# Patient Record
Sex: Female | Born: 1965 | Race: White | Hispanic: No | Marital: Married | Smoking: Never smoker
Health system: Southern US, Community
[De-identification: ages and names within clinical notes are randomized; demographics above are authoritative.]

## PROBLEM LIST (undated history)

## (undated) HISTORY — PX: DILATION AND CURETTAGE OF UTERUS: SHX78

## (undated) HISTORY — PX: TONSILLECTOMY: SUR1361

## (undated) HISTORY — PX: TUBAL LIGATION: SHX77

## (undated) HISTORY — PX: ADENOIDECTOMY: SUR15

---

## 2011-09-26 LAB — HM PAP SMEAR: HM Pap smear: NORMAL

## 2013-04-03 DIAGNOSIS — G118 Other hereditary ataxias: Secondary | ICD-10-CM | POA: Insufficient documentation

## 2013-09-25 HISTORY — PX: CATARACT EXTRACTION: SUR2

## 2013-09-30 ENCOUNTER — Ambulatory Visit: Payer: Self-pay | Admitting: Internal Medicine

## 2013-12-24 LAB — HM COLONOSCOPY

## 2014-05-11 DIAGNOSIS — H269 Unspecified cataract: Secondary | ICD-10-CM | POA: Insufficient documentation

## 2014-10-06 ENCOUNTER — Ambulatory Visit: Payer: Self-pay | Admitting: Internal Medicine

## 2014-10-06 LAB — HM MAMMOGRAPHY

## 2014-10-13 ENCOUNTER — Ambulatory Visit: Payer: Self-pay | Admitting: Internal Medicine

## 2015-04-14 ENCOUNTER — Encounter: Payer: Self-pay | Admitting: Internal Medicine

## 2015-04-14 ENCOUNTER — Ambulatory Visit (INDEPENDENT_AMBULATORY_CARE_PROVIDER_SITE_OTHER): Payer: BC Managed Care – PPO | Admitting: Internal Medicine

## 2015-04-14 VITALS — BP 112/70 | HR 60 | Temp 97.8°F | Ht 67.0 in | Wt 170.8 lb

## 2015-04-14 DIAGNOSIS — G43009 Migraine without aura, not intractable, without status migrainosus: Secondary | ICD-10-CM | POA: Insufficient documentation

## 2015-04-14 DIAGNOSIS — J0111 Acute recurrent frontal sinusitis: Secondary | ICD-10-CM

## 2015-04-14 DIAGNOSIS — G43109 Migraine with aura, not intractable, without status migrainosus: Secondary | ICD-10-CM | POA: Insufficient documentation

## 2015-04-14 DIAGNOSIS — M069 Rheumatoid arthritis, unspecified: Secondary | ICD-10-CM | POA: Insufficient documentation

## 2015-04-14 DIAGNOSIS — E782 Mixed hyperlipidemia: Secondary | ICD-10-CM | POA: Insufficient documentation

## 2015-04-14 DIAGNOSIS — E236 Other disorders of pituitary gland: Secondary | ICD-10-CM | POA: Insufficient documentation

## 2015-04-14 DIAGNOSIS — E785 Hyperlipidemia, unspecified: Secondary | ICD-10-CM | POA: Insufficient documentation

## 2015-04-14 DIAGNOSIS — E039 Hypothyroidism, unspecified: Secondary | ICD-10-CM | POA: Insufficient documentation

## 2015-04-14 DIAGNOSIS — M1990 Unspecified osteoarthritis, unspecified site: Secondary | ICD-10-CM | POA: Insufficient documentation

## 2015-04-14 MED ORDER — AMOXICILLIN-POT CLAVULANATE 875-125 MG PO TABS: 1.0000 | ORAL_TABLET | Freq: Two times a day (BID) | ORAL | Status: DC

## 2015-04-14 NOTE — Progress Notes (Signed)
Date:  04/14/2015   Name:  Cindy Fowler   DOB:  [birth year]   MRN:  [id number]   Chief Complaint: Cough Cough This is a new problem. The current episode started in the past 7 days. The problem has been gradually worsening. The problem occurs constantly. The cough is non-productive (dry and hacking.). Associated symptoms include ear congestion, nasal congestion, postnasal drip and a sore throat. Pertinent negatives include no chest pain, chills, ear pain, fever, heartburn or myalgias. The symptoms are aggravated by lying down and exercise. She has tried OTC cough suppressant for the symptoms. The treatment provided mild relief. There is no history of asthma.     Review of Systems:  Review of Systems  Constitutional: Positive for fatigue. Negative for fever and chills.  HENT: Positive for congestion, postnasal drip, sinus pressure and sore throat. Negative for ear pain, sneezing, trouble swallowing and voice change.   Eyes: Negative for discharge.  Respiratory: Positive for cough.   Cardiovascular: Negative for chest pain, palpitations and leg swelling.  Gastrointestinal: Negative for heartburn, abdominal pain and diarrhea.  Musculoskeletal: Negative for myalgias.  Neurological: Negative for dizziness and light-headedness.    Patient Active Problem List   Diagnosis Date Noted  . Hyperlipidemia 04/14/2015  . Hypothyroidism 04/14/2015  . Empty sella 04/14/2015  . 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 Arthritis 04/14/2015  . Cataract 05/11/2014  . 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 Authorizing Provider  acetaZOLAMIDE (DIAMOX) 125 MG tablet Take 1 tablet by mouth 2 (two) times daily.   Yes Historical Provider, MD  Calcium Carbonate 1500 (600 CA) MG TABS Take 1 tablet by mouth daily at 2 PM daily at 2 PM.   Yes Historical Provider, MD  Cholecalciferol (VITAMIN D-1000 MAX ST)  1000 UNITS tablet Take 1 tablet by mouth daily at 2 PM daily at 2 PM.   Yes Historical Provider, MD  levothyroxine (SYNTHROID) 112 MCG tablet Take 1 tablet by mouth daily at 2 PM daily at 2 PM.   Yes Historical Provider, MD  MULTIPLE VITAMIN PO Take by mouth.   Yes Historical Provider, MD  prednisoLONE acetate (PRED FORTE) 1 % ophthalmic suspension 1 drop right eye four times a day for 1 week, then 1 drop right eye two times a day for 1 week, then stop. 04/06/15  Yes Historical Provider, MD  venlafaxine XR (EFFEXOR-XR) 37.5 MG 24 hr capsule Take 1 capsule by mouth daily at 2 PM daily at 2 PM.   Yes Historical Provider, MD    Allergies  Allergen Reactions  . Fluconazole Swelling    eyes  . Propoxyphene Nausea And Vomiting  . Clarithromycin Palpitations and Anxiety    Shakey, GI upset    Past Surgical History  Procedure Laterality Date  . Cataract extraction Bilateral 2015  . Cesarean section  1994  . Adenoidectomy    . Tonsillectomy    . Dilation and curettage of uterus    . Tubal ligation      History  Substance Use Topics  . Smoking status: Never Smoker   . Smokeless tobacco: Not on file  . Alcohol Use: 0.0 oz/week    0 Standard drinks or equivalent per week     Medication list has been reviewed and updated.  Physical Examination:  Physical Exam  Constitutional: She appears well-developed and well-nourished.  HENT:  Right Ear: Tympanic membrane and ear canal  normal.  Left Ear: Tympanic membrane and ear canal normal.  Nose: Right sinus exhibits maxillary sinus tenderness and frontal sinus tenderness. Left sinus exhibits maxillary sinus tenderness and frontal sinus tenderness.  Mouth/Throat: Uvula is midline, oropharynx is clear and moist and mucous membranes are normal.  Eyes: Conjunctivae are normal.  Neck: Normal range of motion. Neck supple. Carotid bruit is not present. No thyromegaly present.  Cardiovascular: Normal rate and regular rhythm.   Pulmonary/Chest:  Effort normal and breath sounds normal. She has no wheezes. She has no rhonchi.  Lymphadenopathy:    She has no cervical adenopathy.  Psychiatric: She has a normal mood and affect.    BP 112/70 mmHg  Pulse 60  Temp(Src) 97.8 F (36.6 C)  Ht 5\' 7"  (1.702 m)  Wt 170 lb 12.8 oz (77.474 kg)  BMI 26.74 kg/m2  SpO2 100%  Assessment and Plan: 1. Acute recurrent frontal sinusitis Continue Flonase spray; begin claritin daily Continue otc cough syrup as needed - amoxicillin-clavulanate (AUGMENTIN) 875-125 MG per tablet; Take 1 tablet by mouth 2 (two) times daily.  Dispense: 20 tablet; Refill: 0   Halina Maidens, MD [facility]  04/14/2015

## 2016-05-05 ENCOUNTER — Encounter: Payer: Self-pay | Admitting: Internal Medicine

## 2016-05-05 ENCOUNTER — Ambulatory Visit (INDEPENDENT_AMBULATORY_CARE_PROVIDER_SITE_OTHER): Payer: BC Managed Care – PPO | Admitting: Internal Medicine

## 2016-05-05 VITALS — BP 120/80 | HR 68 | Ht 67.0 in | Wt 175.0 lb

## 2016-05-05 DIAGNOSIS — E034 Atrophy of thyroid (acquired): Secondary | ICD-10-CM | POA: Diagnosis not present

## 2016-05-05 DIAGNOSIS — N939 Abnormal uterine and vaginal bleeding, unspecified: Secondary | ICD-10-CM | POA: Diagnosis not present

## 2016-05-05 DIAGNOSIS — E038 Other specified hypothyroidism: Secondary | ICD-10-CM

## 2016-05-05 NOTE — Progress Notes (Signed)
Date:  05/05/2016   Name:  Cindy Fowler   DOB:  04-06-1966   MRN:  [id number]   Chief Complaint: Vaginal Bleeding (spotting x 3 weeks everyday, enough to wear a panty liner. "mixed with that I'm tired and achy, so I thought maybe it was my thyroid") Vaginal Bleeding  This is a new problem. The current episode started 1 to 4 weeks ago. The problem occurs daily. The problem has been unchanged. Associated symptoms include constipation. Pertinent negatives include no chills or fever. She is sexually active. Her menstrual history has been regular (History of endometrial biopsy, heavy menstrual bleeding treated both with IUD and oral contraceptives; neither of these treatments were previously tolerated).   Fatigue - complains of general fatigue and some off and on joint discomfort. She is on thyroid medication but has not had labs checked in more than 1 year. She was uncertain whether her symptoms could be coming from thyroid or from her menstrual issues. She also wasn't certain whether her menstrual issues might not be normal.   Review of Systems  Constitutional: Positive for fatigue. Negative for chills and fever.  Eyes: Negative for visual disturbance.  Respiratory: Negative for cough, chest tightness and shortness of breath.   Cardiovascular: Negative for chest pain, palpitations and leg swelling.  Gastrointestinal: Positive for constipation.  Genitourinary: Positive for menstrual problem and vaginal bleeding.  Musculoskeletal: Positive for arthralgias.    Patient Active Problem List   Diagnosis Date Noted  . Hyperlipidemia 04/14/2015  . Hypothyroidism 04/14/2015  . Empty sella ([place]) 04/14/2015  . 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 Arthritis 04/14/2015  . Cataract 05/11/2014  . Episodic ataxia ([place]) 04/03/2013    Prior to Admission medications   Medication Sig Start Date End Date Taking? Authorizing Provider  acetaZOLAMIDE (DIAMOX) 125  MG tablet Take 1 tablet by mouth 2 (two) times daily.   Yes Historical Provider, MD  Calcium Carbonate 1500 (600 CA) MG TABS Take 1 tablet by mouth daily at 2 PM daily at 2 PM.   Yes Historical Provider, MD  Cholecalciferol (VITAMIN D-1000 MAX ST) 1000 UNITS tablet Take 1 tablet by mouth daily at 2 PM daily at 2 PM.   Yes Historical Provider, MD  levothyroxine (SYNTHROID) 112 MCG tablet Take 1 tablet by mouth daily at 2 PM daily at 2 PM.   Yes Historical Provider, MD  MULTIPLE VITAMIN PO Take by mouth.   Yes Historical Provider, MD  venlafaxine XR (EFFEXOR-XR) 75 MG 24 hr capsule Take 1 capsule by mouth daily at 2 PM daily at 2 PM.   Yes Historical Provider, MD    Allergies  Allergen Reactions  . Fluconazole Swelling    eyes  . Propoxyphene Nausea And Vomiting  . Clarithromycin Palpitations and Anxiety    Shakey, GI upset    Past Surgical History:  Procedure Laterality Date  . ADENOIDECTOMY    . CATARACT EXTRACTION Bilateral 2015  . CESAREAN SECTION  1994  . DILATION AND CURETTAGE OF UTERUS    . TONSILLECTOMY    . TUBAL LIGATION      Social History  Substance Use Topics  . Smoking status: Never Smoker  . Smokeless tobacco: Not on file  . Alcohol use 0.0 oz/week     Medication list has been reviewed and updated.   Physical Exam  Constitutional: She is oriented to person, place, and time. She appears well-developed. No distress.  HENT:  Head: Normocephalic and atraumatic.  Cardiovascular: Normal rate, regular rhythm and normal heart sounds.   Pulmonary/Chest: Effort normal and breath sounds normal. No respiratory distress.  Musculoskeletal: Normal range of motion. She exhibits no edema or tenderness.       Right wrist: Normal.       Left wrist: Normal.       Right ankle: Normal.       Left ankle: Normal.  Neurological: She is alert and oriented to person, place, and time.  Skin: Skin is warm and dry. No rash noted.  Psychiatric: She has a normal mood and affect. Her  behavior is normal. Thought content normal.  Nursing note and vitals reviewed.   BP 120/80   Pulse 68   Ht 5\' 7"  (1.702 m)   Wt 175 lb (79.4 kg)   LMP 04/09/2016 (Exact Date)   BMI 27.41 kg/m   Assessment and Plan: 1. Hypothyroidism due to acquired atrophy of thyroid Continue medication - will adjust dose if needed Follow up with Endo otherwise - TSH  2. Abnormal vaginal bleeding Check labs Follow up with GYN - CBC with Differential/Platelet   Halina Maidens, MD [facility]  05/05/2016

## 2016-05-06 LAB — CBC WITH DIFFERENTIAL/PLATELET
BASOS ABS: 0 10*3/uL (ref 0.0–0.2)
Basos: 0 %
EOS (ABSOLUTE): 0.1 10*3/uL (ref 0.0–0.4)
Eos: 2 %
Hematocrit: 40 % (ref 34.0–46.6)
Hemoglobin: 13.5 g/dL (ref 11.1–15.9)
IMMATURE GRANS (ABS): 0 10*3/uL (ref 0.0–0.1)
Immature Granulocytes: 0 %
LYMPHS: 27 %
Lymphocytes Absolute: 2 10*3/uL (ref 0.7–3.1)
MCH: 32.1 pg (ref 26.6–33.0)
MCHC: 33.8 g/dL (ref 31.5–35.7)
MCV: 95 fL (ref 79–97)
MONOS ABS: 0.8 10*3/uL (ref 0.1–0.9)
Monocytes: 10 %
NEUTROS ABS: 4.5 10*3/uL (ref 1.4–7.0)
Neutrophils: 61 %
PLATELETS: 195 10*3/uL (ref 150–379)
RBC: 4.2 x10E6/uL (ref 3.77–5.28)
RDW: 13 % (ref 12.3–15.4)
WBC: 7.5 10*3/uL (ref 3.4–10.8)

## 2016-05-06 LAB — TSH: TSH: 2.37 u[IU]/mL (ref 0.450–4.500)

## 2016-07-21 ENCOUNTER — Ambulatory Visit (INDEPENDENT_AMBULATORY_CARE_PROVIDER_SITE_OTHER): Payer: BC Managed Care – PPO | Admitting: Internal Medicine

## 2016-07-21 ENCOUNTER — Encounter: Payer: Self-pay | Admitting: Internal Medicine

## 2016-07-21 VITALS — BP 136/80 | HR 72 | Ht 67.0 in | Wt 177.0 lb

## 2016-07-21 DIAGNOSIS — J0100 Acute maxillary sinusitis, unspecified: Secondary | ICD-10-CM | POA: Diagnosis not present

## 2016-07-21 MED ORDER — AMOXICILLIN-POT CLAVULANATE 875-125 MG PO TABS: 1.0000 | ORAL_TABLET | Freq: Two times a day (BID) | ORAL | 0 refills | Status: DC

## 2016-07-21 NOTE — Progress Notes (Signed)
Date:  07/21/2016   Name:  Cindy Fowler   DOB:  1966-02-03   MRN:  [id number]   Chief Complaint: Cough (had a "cold" last week with cough and cong.- seemed to have gotten better, went out to exercise and now has the same symptoms, but worse. Drainage without production) Cough  This is a new problem. The current episode started in the past 7 days. The problem has been waxing and waning. The problem occurs hourly. The cough is non-productive. Associated symptoms include headaches, postnasal drip, rhinorrhea and a sore throat. Pertinent negatives include no chest pain, chills, fever, shortness of breath or wheezing. The symptoms are aggravated by exercise. She has tried nothing for the symptoms.      Review of Systems  Constitutional: Negative for chills, fatigue and fever.  HENT: Positive for congestion, postnasal drip, rhinorrhea and sore throat.   Respiratory: Positive for cough. Negative for chest tightness, shortness of breath and wheezing.   Cardiovascular: Negative for chest pain, palpitations and leg swelling.  Neurological: Positive for headaches.    Patient Active Problem List   Diagnosis Date Noted  . Abnormal vaginal bleeding 05/05/2016  . Hyperlipidemia 04/14/2015  . Hypothyroidism 04/14/2015  . Empty sella ([place]) 04/14/2015  . 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 Arthritis 04/14/2015  . Cataract 05/11/2014  . Episodic ataxia ([place]) 04/03/2013    Prior to Admission medications   Medication Sig Start Date End Date Taking? Authorizing Provider  acetaZOLAMIDE (DIAMOX) 125 MG tablet Take 1 tablet by mouth 2 (two) times daily.   Yes Historical Provider, MD  Calcium Carbonate 1500 (600 CA) MG TABS Take 1 tablet by mouth daily at 2 PM daily at 2 PM.   Yes Historical Provider, MD  Cholecalciferol (VITAMIN D-1000 MAX ST) 1000 UNITS tablet Take 1 tablet by mouth daily at 2 PM daily at 2 PM.   Yes Historical Provider, MD  levothyroxine  (SYNTHROID) 112 MCG tablet Take 1 tablet by mouth daily at 2 PM daily at 2 PM.   Yes Historical Provider, MD  MULTIPLE VITAMIN PO Take by mouth.   Yes Historical Provider, MD  venlafaxine XR (EFFEXOR-XR) 75 MG 24 hr capsule Take 1 capsule by mouth daily at 2 PM daily at 2 PM.   Yes Historical Provider, MD    Allergies  Allergen Reactions  . Fluconazole Swelling    eyes  . Propoxyphene Nausea And Vomiting  . Clarithromycin Palpitations and Anxiety    Shakey, GI upset    Past Surgical History:  Procedure Laterality Date  . ADENOIDECTOMY    . CATARACT EXTRACTION Bilateral 2015  . CESAREAN SECTION  1994  . DILATION AND CURETTAGE OF UTERUS    . TONSILLECTOMY    . TUBAL LIGATION      Social History  Substance Use Topics  . Smoking status: Never Smoker  . Smokeless tobacco: Not on file  . Alcohol use 0.0 oz/week     Medication list has been reviewed and updated.   Physical Exam  Constitutional: She is oriented to person, place, and time. She appears well-developed and well-nourished.  HENT:  Right Ear: External ear and ear canal normal. Tympanic membrane is not erythematous and not retracted.  Left Ear: External ear and ear canal normal. Tympanic membrane is not erythematous and not retracted.  Nose: Right sinus exhibits maxillary sinus tenderness and frontal sinus tenderness. Left sinus exhibits maxillary sinus tenderness and frontal sinus tenderness.  Mouth/Throat: Uvula is  midline and mucous membranes are normal. No oral lesions. Posterior oropharyngeal erythema present. No oropharyngeal exudate.  Neck: Normal range of motion.  Cardiovascular: Normal rate, regular rhythm and normal heart sounds.   Pulmonary/Chest: Breath sounds normal. She has no wheezes. She has no rales.  Lymphadenopathy:    She has no cervical adenopathy.  Neurological: She is alert and oriented to person, place, and time.    BP 136/80   Pulse 72   Ht 5\' 7"  (1.702 m)   Wt 177 lb (80.3 kg)   LMP  07/10/2016 (Exact Date)   BMI 27.72 kg/m   Assessment and Plan: 1. Acute maxillary sinusitis, recurrence not specified Continue flonase Add Afrin, fluids, Advil - amoxicillin-clavulanate (AUGMENTIN) 875-125 MG tablet; Take 1 tablet by mouth 2 (two) times daily.  Dispense: 20 tablet; Refill: 0   Halina Maidens, MD [facility]  07/21/2016

## 2016-11-10 ENCOUNTER — Encounter: Payer: Self-pay | Admitting: Internal Medicine

## 2016-11-10 ENCOUNTER — Ambulatory Visit (INDEPENDENT_AMBULATORY_CARE_PROVIDER_SITE_OTHER): Payer: BC Managed Care – PPO | Admitting: Internal Medicine

## 2016-11-10 VITALS — BP 128/64 | HR 64 | Temp 98.0°F | Ht 67.0 in | Wt 178.0 lb

## 2016-11-10 DIAGNOSIS — J40 Bronchitis, not specified as acute or chronic: Secondary | ICD-10-CM | POA: Diagnosis not present

## 2016-11-10 MED ORDER — DOXYCYCLINE HYCLATE 100 MG PO TABS: 100.0000 mg | ORAL_TABLET | Freq: Two times a day (BID) | ORAL | 0 refills | Status: DC

## 2016-11-10 NOTE — Progress Notes (Signed)
Date:  11/10/2016   Name:  Cindy Fowler   DOB:  17-Aug-1966   MRN:  [id number]   Chief Complaint: Cough (X 1 week. No production. Chest sore from cough.) Cough  This is a new problem. The current episode started in the past 7 days. The problem has been gradually worsening. The problem occurs every few minutes. The cough is non-productive. Associated symptoms include chest pain, a fever, a sore throat and shortness of breath. Pertinent negatives include no chills, ear pain or headaches. She has tried OTC cough suppressant for the symptoms. The treatment provided mild relief.      Review of Systems  Constitutional: Positive for fever. Negative for chills and fatigue.  HENT: Positive for congestion, sinus pressure, sore throat and voice change. Negative for ear pain.   Eyes: Negative for visual disturbance.  Respiratory: Positive for cough and shortness of breath.   Cardiovascular: Positive for chest pain.  Gastrointestinal: Negative for abdominal pain, nausea and vomiting.  Neurological: Negative for dizziness, numbness and headaches.    Patient Active Problem List   Diagnosis Date Noted  . Abnormal vaginal bleeding 05/05/2016  . Hyperlipidemia 04/14/2015  . Hypothyroidism 04/14/2015  . Empty sella ([place]) 04/14/2015  . 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 Arthritis 04/14/2015  . Cataract 05/11/2014  . Episodic ataxia ([place]) 04/03/2013    Prior to Admission medications   Medication Sig Start Date End Date Taking? Authorizing Provider  acetaZOLAMIDE (DIAMOX) 125 MG tablet Take 1 tablet by mouth 2 (two) times daily.   Yes Historical Provider, MD  Calcium Carbonate 1500 (600 CA) MG TABS Take 1 tablet by mouth daily at 2 PM daily at 2 PM.   Yes Historical Provider, MD  Cholecalciferol (VITAMIN D-1000 MAX ST) 1000 UNITS tablet Take 1 tablet by mouth daily at 2 PM daily at 2 PM.   Yes Historical Provider, MD  levothyroxine (SYNTHROID) 112 MCG  tablet Take 1 tablet by mouth daily at 2 PM daily at 2 PM.   Yes Historical Provider, MD  MULTIPLE VITAMIN PO Take by mouth.   Yes Historical Provider, MD  venlafaxine XR (EFFEXOR-XR) 75 MG 24 hr capsule Take 1 capsule by mouth daily at 2 PM daily at 2 PM.   Yes Historical Provider, MD    Allergies  Allergen Reactions  . Fluconazole Swelling    eyes  . Propoxyphene Nausea And Vomiting  . Clarithromycin Palpitations and Anxiety    Shakey, GI upset    Past Surgical History:  Procedure Laterality Date  . ADENOIDECTOMY    . CATARACT EXTRACTION Bilateral 2015  . CESAREAN SECTION  1994  . DILATION AND CURETTAGE OF UTERUS    . TONSILLECTOMY    . TUBAL LIGATION      Social History  Substance Use Topics  . Smoking status: Never Smoker  . Smokeless tobacco: Never Used  . Alcohol use 0.0 oz/week     Medication list has been reviewed and updated.   Physical Exam  Constitutional: She is oriented to person, place, and time. She appears well-developed. No distress.  HENT:  Head: Normocephalic and atraumatic.  Right Ear: Tympanic membrane and ear canal normal.  Left Ear: Tympanic membrane and ear canal normal.  Nose: Right sinus exhibits maxillary sinus tenderness. Right sinus exhibits no frontal sinus tenderness. Left sinus exhibits maxillary sinus tenderness. Left sinus exhibits no frontal sinus tenderness.  Mouth/Throat: No posterior oropharyngeal edema or posterior oropharyngeal erythema.  Neck: Normal  range of motion. Neck supple.  Cardiovascular: Normal rate, regular rhythm and normal heart sounds.   Pulmonary/Chest: Effort normal and breath sounds normal. No respiratory distress. She has no decreased breath sounds. She has no wheezes.  Musculoskeletal: Normal range of motion.  Neurological: She is alert and oriented to person, place, and time.  Skin: Skin is warm and dry. No rash noted.  Psychiatric: She has a normal mood and affect. Her behavior is normal. Thought content  normal.  Nursing note and vitals reviewed.   BP 128/64   Pulse 64   Temp 98 F (36.7 C)   Ht 5\' 7"  (1.702 m)   Wt 178 lb (80.7 kg)   LMP 10/26/2016 (Exact Date)   SpO2 98%   BMI 27.88 kg/m   Assessment and Plan: 1. Bronchitis And sinus drainage Begin Claritin and Delsym Continue advil as needed - doxycycline (VIBRA-TABS) 100 MG tablet; Take 1 tablet (100 mg total) by mouth 2 (two) times daily.  Dispense: 20 tablet; Refill: 0   Halina Maidens, MD [facility]  11/10/2016

## 2016-11-10 NOTE — Patient Instructions (Signed)
Delsym for cough  Claritin/zyrtec/allegra - once a day for sinus sx

## 2016-11-14 ENCOUNTER — Encounter: Payer: Self-pay | Admitting: Internal Medicine

## 2017-04-17 ENCOUNTER — Ambulatory Visit (INDEPENDENT_AMBULATORY_CARE_PROVIDER_SITE_OTHER): Payer: BC Managed Care – PPO | Admitting: Internal Medicine

## 2017-04-17 ENCOUNTER — Encounter: Payer: Self-pay | Admitting: Internal Medicine

## 2017-04-17 VITALS — BP 118/62 | HR 66 | Ht 67.0 in | Wt 175.0 lb

## 2017-04-17 DIAGNOSIS — Z1239 Encounter for other screening for malignant neoplasm of breast: Secondary | ICD-10-CM

## 2017-04-17 DIAGNOSIS — G118 Other hereditary ataxias: Secondary | ICD-10-CM

## 2017-04-17 DIAGNOSIS — Z0000 Encounter for general adult medical examination without abnormal findings: Secondary | ICD-10-CM

## 2017-04-17 DIAGNOSIS — G43109 Migraine with aura, not intractable, without status migrainosus: Secondary | ICD-10-CM | POA: Diagnosis not present

## 2017-04-17 DIAGNOSIS — E034 Atrophy of thyroid (acquired): Secondary | ICD-10-CM | POA: Diagnosis not present

## 2017-04-17 LAB — POCT URINALYSIS DIPSTICK
Bilirubin, UA: NEGATIVE
GLUCOSE UA: NEGATIVE
Ketones, UA: NEGATIVE
Leukocytes, UA: NEGATIVE
NITRITE UA: NEGATIVE
PH UA: 6 (ref 5.0–8.0)
PROTEIN UA: NEGATIVE
RBC UA: NEGATIVE
UROBILINOGEN UA: 0.2 U/dL

## 2017-04-17 NOTE — Patient Instructions (Signed)
Melatonin for sleep - take up 10 mg   Breast Self-Awareness Breast self-awareness means being familiar with how your breasts look and feel. It involves checking your breasts regularly and reporting any changes to your health care provider. Practicing breast self-awareness is important. A change in your breasts can be a sign of a serious medical problem. Being familiar with how your breasts look and feel allows you to find any problems early, when treatment is more likely to be successful. All women should practice breast self-awareness, including women who have had breast implants. How to do a breast self-exam One way to learn what is normal for your breasts and whether your breasts are changing is to do a breast self-exam. To do a breast self-exam: Look for Changes  1. Remove all the clothing above your waist. 2. Stand in front of a mirror in a room with good lighting. 3. Put your hands on your hips. 4. Push your hands firmly downward. 5. Compare your breasts in the mirror. Look for differences between them (asymmetry), such as: ? Differences in shape. ? Differences in size. ? Puckers, dips, and bumps in one breast and not the other. 6. Look at each breast for changes in your skin, such as: ? Redness. ? Scaly areas. 7. Look for changes in your nipples, such as: ? Discharge. ? Bleeding. ? Dimpling. ? Redness. ? A change in position. Feel for Changes  Carefully feel your breasts for lumps and changes. It is best to do this while lying on your back on the floor and again while sitting or standing in the shower or tub with soapy water on your skin. Feel each breast in the following way:  Place the arm on the side of the breast you are examining above your head.  Feel your breast with the other hand.  Start in the nipple area and make  inch (2 cm) overlapping circles to feel your breast. Use the pads of your three middle fingers to do this. Apply light pressure, then medium pressure,  then firm pressure. The light pressure will allow you to feel the tissue closest to the skin. The medium pressure will allow you to feel the tissue that is a little deeper. The firm pressure will allow you to feel the tissue close to the ribs.  Continue the overlapping circles, moving downward over the breast until you feel your ribs below your breast.  Move one finger-width toward the center of the body. Continue to use the  inch (2 cm) overlapping circles to feel your breast as you move slowly up toward your collarbone.  Continue the up and down exam using all three pressures until you reach your armpit.  Write Down What You Find  Write down what is normal for each breast and any changes that you find. Keep a written record with breast changes or normal findings for each breast. By writing this information down, you do not need to depend only on memory for size, tenderness, or location. Write down where you are in your menstrual cycle, if you are still menstruating. If you are having trouble noticing differences in your breasts, do not get discouraged. With time you will become more familiar with the variations in your breasts and more comfortable with the exam. How often should I examine my breasts? Examine your breasts every month. If you are breastfeeding, the best time to examine your breasts is after a feeding or after using a breast pump. If you menstruate, the best  time to examine your breasts is 5-7 days after your period is over. During your period, your breasts are lumpier, and it may be more difficult to notice changes. When should I see my health care provider? See your health care provider if you notice:  A change in shape or size of your breasts or nipples.  A change in the skin of your breast or nipples, such as a reddened or scaly area.  Unusual discharge from your nipples.  A lump or thick area that was not there before.  Pain in your breasts.  Anything that concerns  you.  This information is not intended to replace advice given to you by your health care provider. Make sure you discuss any questions you have with your health care provider. Document Released: 09/11/2005 Document Revised: 02/17/2016 Document Reviewed: 08/01/2015 Elsevier Interactive Patient Education  [employer].

## 2017-04-17 NOTE — Progress Notes (Signed)
Date:  04/17/2017   Name:  Cindy Fowler   DOB:  1966-08-29   MRN:  [id number]   Chief Complaint: Annual Exam and Hypothyroidism Cindy Fowler is a 51 y.o. female who presents today for her Complete Annual Exam. She feels fairly well. She reports exercising regularly. She reports she is sleeping poorly due to night sweats. She sees OB GYN at [place] - Pap done last year.  Mammogram is due. She denies breast mass or tenderness. She is having some night sweats and sleep is interrupted.  She is feeling slightly more fatigued than usual but is also training for a 10 K.  Thyroid Problem  Presents for follow-up visit. Symptoms include fatigue. Patient reports no anxiety, constipation, diarrhea, palpitations or tremors. The symptoms have been stable.   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  Last seen in 08/2016.  She continues on diamox with good effect.  Actually training for a 10K race.  Atypical migraine - stable symptoms.  On venlafaxine. Has a worse headache about once a week or less. Sometimes take Advil if needed.  Review of Systems  Constitutional: Positive for fatigue. Negative for chills and fever.  HENT: Negative for congestion, hearing loss, tinnitus, trouble swallowing and voice change.   Eyes: Negative for visual disturbance.  Respiratory: Negative for cough, chest tightness, shortness of breath and wheezing.   Cardiovascular: Negative for chest pain, palpitations and leg swelling.  Gastrointestinal: Negative for abdominal pain, constipation, diarrhea and vomiting.  Endocrine: Negative for polydipsia and polyuria.  Genitourinary: Negative for dysuria, frequency, genital sores, vaginal bleeding and vaginal discharge.  Musculoskeletal: Negative for arthralgias, gait problem and joint swelling.  Skin: Negative for color change and rash.  Neurological: Positive for headaches. Negative for dizziness, tremors and light-headedness.  Hematological: Negative  for adenopathy. Does not bruise/bleed easily.  Psychiatric/Behavioral: Positive for sleep disturbance. Negative for dysphoric mood. The patient is not nervous/anxious.     Patient Active Problem List   Diagnosis Date Noted  . Abnormal vaginal bleeding 05/05/2016  . Hyperlipidemia 04/14/2015  . Hypothyroidism 04/14/2015  . Empty sella ([place]) 04/14/2015  . 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 Arthritis 04/14/2015  . Cataract 05/11/2014  . Episodic ataxia ([place]) 04/03/2013    Prior to Admission medications   Medication Sig Start Date End Date Taking? Authorizing Provider  acetaZOLAMIDE (DIAMOX) 125 MG tablet Take by mouth. 08/28/16  Yes [provider]  levothyroxine (SYNTHROID) 112 MCG tablet Take by mouth. 06/29/16  Yes [provider]  venlafaxine XR (EFFEXOR-XR) 75 MG 24 hr capsule Take by mouth. 08/28/16  Yes [provider]  Calcium Carbonate 1500 (600 CA) MG TABS Take 1 tablet by mouth daily at 2 PM daily at 2 PM.    [provider]  Cholecalciferol (VITAMIN D-1000 MAX ST) 1000 UNITS tablet Take 1 tablet by mouth daily at 2 PM daily at 2 PM.    [provider]  doxycycline (VIBRA-TABS) 100 MG tablet Take 1 tablet (100 mg total) by mouth 2 (two) times daily. 11/10/16   Glean Hess, MD  MULTIPLE VITAMIN PO Take by mouth.    [provider]    Allergies  Allergen Reactions  . Fluconazole Swelling    eyes  . Propoxyphene Nausea And Vomiting  . Clarithromycin Palpitations and Anxiety    Shakey, GI upset    Past Surgical History:  Procedure Laterality Date  . ADENOIDECTOMY    . CATARACT  EXTRACTION Bilateral 2015  . CESAREAN SECTION  1994  . DILATION AND CURETTAGE OF UTERUS    . TONSILLECTOMY    . TUBAL LIGATION      Social History  Substance Use Topics  . Smoking status: Never Smoker  . Smokeless tobacco: Never Used  . Alcohol use 0.0 oz/week   Depression screen [facility] 2/9 04/17/2017    Decreased Interest 0  Down, Depressed, Hopeless 0  PHQ - 2 Score 0    Medication list has been reviewed and updated.  Physical Exam  Constitutional: She is oriented to person, place, and time. She appears well-developed and well-nourished. No distress.  HENT:  Head: Normocephalic and atraumatic.  Right Ear: Tympanic membrane and ear canal normal.  Left Ear: Tympanic membrane and ear canal normal.  Nose: Right sinus exhibits no maxillary sinus tenderness. Left sinus exhibits no maxillary sinus tenderness.  Mouth/Throat: Uvula is midline and oropharynx is clear and moist.  Eyes: Conjunctivae and EOM are normal. Right eye exhibits no discharge. Left eye exhibits no discharge. No scleral icterus.  Neck: Normal range of motion. Carotid bruit is not present. No erythema present. No thyromegaly present.  Cardiovascular: Normal rate, regular rhythm, normal heart sounds and normal pulses.   Pulmonary/Chest: Effort normal. No respiratory distress. She has no wheezes.  Abdominal: Soft. Bowel sounds are normal. There is no hepatosplenomegaly. There is no tenderness. There is no CVA tenderness.  Musculoskeletal: She exhibits no edema or tenderness.  Lymphadenopathy:    She has no cervical adenopathy.    She has no axillary adenopathy.  Neurological: She is alert and oriented to person, place, and time. She has normal reflexes. No cranial nerve deficit or sensory deficit.  Skin: Skin is warm, dry and intact. No rash noted.  Psychiatric: She has a normal mood and affect. Her speech is normal and behavior is normal. Thought content normal.  Nursing note and vitals reviewed.   BP 118/62   Pulse 66   Ht 5\' 7"  (1.702 m)   Wt 175 lb (79.4 kg)   LMP 04/06/2017   SpO2 98%   BMI 27.41 kg/m   Assessment and Plan: 1. Annual physical exam Normal exam Suspect mild fatigue from sleep disruption - begin melatonin qhs - Lipid panel - POCT urinalysis dipstick - Hemoglobin A1c  2. Breast cancer  screening Schedule at [place]; Future  3. 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 Episodic ataxia (HCC) controlled - CBC with Differential/Platelet - Comprehensive metabolic panel  5. Basilar artery migraine stable   No orders of the defined types were placed in this encounter.   Halina Maidens, MD [facility]  04/17/2017

## 2017-04-18 LAB — COMPREHENSIVE METABOLIC PANEL
A/G RATIO: 1.7 (ref 1.2–2.2)
ALK PHOS: 51 IU/L (ref 39–117)
ALT: 10 IU/L (ref 0–32)
AST: 23 IU/L (ref 0–40)
Albumin: 4.2 g/dL (ref 3.5–5.5)
BILIRUBIN TOTAL: 0.6 mg/dL (ref 0.0–1.2)
BUN/Creatinine Ratio: 11 (ref 9–23)
BUN: 8 mg/dL (ref 6–24)
CHLORIDE: 101 mmol/L (ref 96–106)
CO2: 23 mmol/L (ref 20–29)
Calcium: 9.6 mg/dL (ref 8.7–10.2)
Creatinine, Ser: 0.7 mg/dL (ref 0.57–1.00)
GFR calc Af Amer: 117 mL/min/{1.73_m2} (ref >59)
GFR calc non Af Amer: 101 mL/min/{1.73_m2} (ref >59)
GLUCOSE: 102 mg/dL — AB (ref 65–99)
Globulin, Total: 2.5 g/dL (ref 1.5–4.5)
POTASSIUM: 4.4 mmol/L (ref 3.5–5.2)
Sodium: 139 mmol/L (ref 134–144)
Total Protein: 6.7 g/dL (ref 6.0–8.5)

## 2017-04-18 LAB — CBC WITH DIFFERENTIAL/PLATELET
BASOS ABS: 0 10*3/uL (ref 0.0–0.2)
BASOS: 0 %
EOS (ABSOLUTE): 0.2 10*3/uL (ref 0.0–0.4)
Eos: 3 %
Hematocrit: 41.2 % (ref 34.0–46.6)
Hemoglobin: 13.6 g/dL (ref 11.1–15.9)
IMMATURE GRANS (ABS): 0 10*3/uL (ref 0.0–0.1)
IMMATURE GRANULOCYTES: 0 %
LYMPHS: 26 %
Lymphocytes Absolute: 1.3 10*3/uL (ref 0.7–3.1)
MCH: 30.6 pg (ref 26.6–33.0)
MCHC: 33 g/dL (ref 31.5–35.7)
MCV: 93 fL (ref 79–97)
MONOS ABS: 0.5 10*3/uL (ref 0.1–0.9)
Monocytes: 11 %
NEUTROS ABS: 2.9 10*3/uL (ref 1.4–7.0)
NEUTROS PCT: 60 %
PLATELETS: 217 10*3/uL (ref 150–379)
RBC: 4.44 x10E6/uL (ref 3.77–5.28)
RDW: 13.9 % (ref 12.3–15.4)
WBC: 4.8 10*3/uL (ref 3.4–10.8)

## 2017-04-18 LAB — LIPID PANEL
CHOL/HDL RATIO: 3.8 ratio (ref 0.0–4.4)
CHOLESTEROL TOTAL: 267 mg/dL — AB (ref 100–199)
HDL: 70 mg/dL (ref >39)
LDL Calculated: 168 mg/dL — ABNORMAL HIGH (ref 0–99)
TRIGLYCERIDES: 147 mg/dL (ref 0–149)
VLDL Cholesterol Cal: 29 mg/dL (ref 5–40)

## 2017-04-18 LAB — HEMOGLOBIN A1C
Est. average glucose Bld gHb Est-mCnc: 105 mg/dL
Hgb A1c MFr Bld: 5.3 % (ref 4.8–5.6)

## 2017-04-18 LAB — TSH: TSH: 0.941 u[IU]/mL (ref 0.450–4.500)

## 2017-04-24 ENCOUNTER — Ambulatory Visit
Admission: RE | Admit: 2017-04-24 | Discharge: 2017-04-24 | Disposition: A | Discharge status: Home / Self Care | Payer: BC Managed Care – PPO | Source: Ambulatory Visit | Attending: Internal Medicine | Admitting: Internal Medicine

## 2017-04-24 DIAGNOSIS — Z1239 Encounter for other screening for malignant neoplasm of breast: Secondary | ICD-10-CM

## 2017-04-24 DIAGNOSIS — Z1231 Encounter for screening mammogram for malignant neoplasm of breast: Secondary | ICD-10-CM | POA: Diagnosis present

## 2017-08-06 ENCOUNTER — Other Ambulatory Visit: Payer: Self-pay

## 2017-08-06 MED ORDER — LEVOTHYROXINE SODIUM 112 MCG PO TABS: 112.0000 ug | ORAL_TABLET | Freq: Every day | ORAL | 1 refills | Status: DC

## 2017-08-06 NOTE — Progress Notes (Signed)
Patient called leaving [facility] requesting refill on thyroid medication. Was told at last visit Dr Army Melia can take over thyroid meds if lab was done. Sent in synthroid medication for 90 with one refill.

## 2017-11-04 ENCOUNTER — Encounter: Payer: Self-pay | Admitting: Internal Medicine

## 2017-11-05 ENCOUNTER — Other Ambulatory Visit: Payer: Self-pay

## 2017-11-05 MED ORDER — LEVOTHYROXINE SODIUM 112 MCG PO TABS: 112.0000 ug | ORAL_TABLET | Freq: Every day | ORAL | 1 refills | Status: DC

## 2018-03-12 ENCOUNTER — Other Ambulatory Visit: Payer: Self-pay | Admitting: Internal Medicine

## 2018-03-12 DIAGNOSIS — Z1231 Encounter for screening mammogram for malignant neoplasm of breast: Secondary | ICD-10-CM

## 2018-04-01 LAB — HM PAP SMEAR: HM Pap smear: NEGATIVE

## 2018-04-01 LAB — RESULTS CONSOLE HPV: CHL HPV: NEGATIVE

## 2018-04-18 ENCOUNTER — Encounter: Payer: BC Managed Care – PPO | Admitting: Internal Medicine

## 2018-04-22 ENCOUNTER — Encounter: Payer: Self-pay | Admitting: Internal Medicine

## 2018-04-22 ENCOUNTER — Ambulatory Visit (INDEPENDENT_AMBULATORY_CARE_PROVIDER_SITE_OTHER): Payer: BC Managed Care – PPO | Admitting: Internal Medicine

## 2018-04-22 VITALS — BP 132/70 | HR 64 | Ht 67.0 in | Wt 173.0 lb

## 2018-04-22 DIAGNOSIS — G8929 Other chronic pain: Secondary | ICD-10-CM | POA: Diagnosis not present

## 2018-04-22 DIAGNOSIS — Z0000 Encounter for general adult medical examination without abnormal findings: Secondary | ICD-10-CM

## 2018-04-22 DIAGNOSIS — Z0001 Encounter for general adult medical examination with abnormal findings: Secondary | ICD-10-CM | POA: Diagnosis not present

## 2018-04-22 DIAGNOSIS — M25561 Pain in right knee: Secondary | ICD-10-CM

## 2018-04-22 DIAGNOSIS — E034 Atrophy of thyroid (acquired): Secondary | ICD-10-CM | POA: Diagnosis not present

## 2018-04-22 DIAGNOSIS — M25562 Pain in left knee: Secondary | ICD-10-CM | POA: Diagnosis not present

## 2018-04-22 DIAGNOSIS — E236 Other disorders of pituitary gland: Secondary | ICD-10-CM

## 2018-04-22 DIAGNOSIS — G43109 Migraine with aura, not intractable, without status migrainosus: Secondary | ICD-10-CM

## 2018-04-22 DIAGNOSIS — G118 Other hereditary ataxias: Secondary | ICD-10-CM

## 2018-04-22 LAB — POCT URINALYSIS DIPSTICK
Bilirubin, UA: NEGATIVE
GLUCOSE UA: NEGATIVE
Ketones, UA: NEGATIVE
LEUKOCYTES UA: NEGATIVE
Nitrite, UA: NEGATIVE
Protein, UA: NEGATIVE
Spec Grav, UA: 1.01 (ref 1.010–1.025)
Urobilinogen, UA: 0.2 E.U./dL
pH, UA: 6.5 (ref 5.0–8.0)

## 2018-04-22 MED ORDER — LEVOTHYROXINE SODIUM 112 MCG PO TABS: 112.0000 ug | ORAL_TABLET | Freq: Every day | ORAL | 1 refills | Status: DC

## 2018-04-22 NOTE — Patient Instructions (Signed)

## 2018-04-22 NOTE — Progress Notes (Signed)
Date:  04/22/2018   Name:  Cindy Fowler   DOB:  07-Dec-1965   MRN:  [id number]   Chief Complaint: Annual Exam Cindy Fowler is a 52 y.o. female who presents today for her Complete Annual Exam. She feels fairly well. She reports exercising walking and swimming. She reports she is sleeping fairly well. She sees GYN routinely. She has a mammogram scheduled for next week.  Thyroid Problem  Presents for follow-up visit. Symptoms include constipation. Patient reports no anxiety, depressed mood, diarrhea, fatigue, palpitations, tremors or weight gain. The symptoms have been stable.  Migraine   This is a recurrent problem. The pain does not radiate. The pain quality is similar to prior headaches. Associated symptoms include dizziness. Pertinent negatives include no abdominal pain, coughing, fever, hearing loss, tinnitus or vomiting.  Knee Pain   There was no injury mechanism. The pain is present in the right knee and left knee. The quality of the pain is described as aching (at times when climbing stairs; able to jog without pain or difficulty). The pain has been fluctuating since onset. Pertinent negatives include no inability to bear weight, loss of motion or loss of sensation. She has tried NSAIDs for the symptoms.  Episodic Ataxia - followed by [facility] for this and migraine, on Diamox and Effexor  Review of Systems  Constitutional: Negative for chills, fatigue, fever and weight gain.  HENT: Negative for congestion, hearing loss, tinnitus, trouble swallowing and voice change.   Eyes: Negative for visual disturbance.  Respiratory: Negative for cough, chest tightness, shortness of breath and wheezing.   Cardiovascular: Negative for chest pain, palpitations and leg swelling.  Gastrointestinal: Positive for constipation. Negative for abdominal distention, abdominal pain, blood in stool, diarrhea and vomiting.  Endocrine: Negative for polydipsia and polyuria.  Genitourinary:  Negative for dysuria, frequency, genital sores, vaginal bleeding and vaginal discharge.  Musculoskeletal: Positive for arthralgias (both knees). Negative for gait problem and joint swelling.  Skin: Negative for color change and rash.  Neurological: Positive for dizziness. Negative for tremors, light-headedness and headaches.  Hematological: Negative for adenopathy. Does not bruise/bleed easily.  Psychiatric/Behavioral: Negative for dysphoric mood and sleep disturbance. The patient is not nervous/anxious.     Patient Active Problem List   Diagnosis Date Noted  . Abnormal vaginal bleeding 05/05/2016  . Hyperlipidemia 04/14/2015  . Hypothyroidism 04/14/2015  . Empty sella ([place]) 04/14/2015  . 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 Arthritis 04/14/2015  . Cataract 05/11/2014  . Episodic ataxia ([place]) 04/03/2013    Prior to Admission medications   Medication Sig Start Date End Date Taking? Authorizing Provider  acetaZOLAMIDE (DIAMOX) 125 MG tablet Take by mouth. 08/28/16  Yes [provider]  Calcium Carbonate 1500 (600 CA) MG TABS Take 1 tablet by mouth daily at 2 PM daily at 2 PM.   Yes [provider]  Cholecalciferol (VITAMIN D-1000 MAX ST) 1000 UNITS tablet Take 1 tablet by mouth daily at 2 PM daily at 2 PM.   Yes [provider]  levothyroxine (SYNTHROID) 112 MCG tablet Take 1 tablet (112 mcg total) by mouth daily before breakfast. 11/05/17  Yes Glean Hess, MD  MULTIPLE VITAMIN PO Take by mouth.   Yes [provider]  venlafaxine XR (EFFEXOR-XR) 75 MG 24 hr capsule Take by mouth. 08/28/16  Yes [provider]    Allergies  Allergen Reactions  . Fluconazole Swelling    eyes  . Propoxyphene Nausea And  Vomiting  . Clarithromycin Palpitations and Anxiety    Shakey, GI upset    Past Surgical History:  Procedure Laterality Date  . ADENOIDECTOMY    . CATARACT EXTRACTION Bilateral 2015  . CESAREAN  SECTION  1994  . DILATION AND CURETTAGE OF UTERUS    . TONSILLECTOMY    . TUBAL LIGATION      Social History   Tobacco Use  . Smoking status: Never Smoker  . Smokeless tobacco: Never Used  Substance Use Topics  . Alcohol use: Yes    Alcohol/week: 0.0 oz  . Drug use: No     Medication list has been reviewed and updated.  Current Meds  Medication Sig  . acetaZOLAMIDE (DIAMOX) 125 MG tablet Take by mouth.  . Calcium Carbonate 1500 (600 CA) MG TABS Take 1 tablet by mouth daily at 2 PM daily at 2 PM.  . Cholecalciferol (VITAMIN D-1000 MAX ST) 1000 UNITS tablet Take 1 tablet by mouth daily at 2 PM daily at 2 PM.  . levothyroxine (SYNTHROID) 112 MCG tablet Take 1 tablet (112 mcg total) by mouth daily before breakfast.  . MULTIPLE VITAMIN PO Take by mouth.  . venlafaxine XR (EFFEXOR-XR) 75 MG 24 hr capsule Take by mouth.    PHQ 2/9 Scores 04/22/2018 04/17/2017  PHQ - 2 Score 0 0    Physical Exam  Constitutional: She is oriented to person, place, and time. She appears well-developed and well-nourished. No distress.  HENT:  Head: Normocephalic and atraumatic.  Right Ear: Tympanic membrane and ear canal normal.  Left Ear: Tympanic membrane and ear canal normal.  Nose: Right sinus exhibits no maxillary sinus tenderness. Left sinus exhibits no maxillary sinus tenderness.  Mouth/Throat: Uvula is midline and oropharynx is clear and moist.  Eyes: Conjunctivae and EOM are normal. Right eye exhibits no discharge. Left eye exhibits no discharge. No scleral icterus.  Neck: Normal range of motion. Carotid bruit is not present. No erythema present. No thyromegaly present.  Cardiovascular: Normal rate, regular rhythm, normal heart sounds and normal pulses.  Pulmonary/Chest: Effort normal. No respiratory distress. She has no wheezes.  Abdominal: Soft. Bowel sounds are normal. There is no hepatosplenomegaly. There is no tenderness. There is no CVA tenderness.  Musculoskeletal: Normal range of  motion.       Right knee: She exhibits normal range of motion, no swelling and no effusion.       Left knee: She exhibits normal range of motion, no swelling and no effusion.  Left patella hypermobile Mild crepitus of right knee with passive ROM  Lymphadenopathy:    She has no cervical adenopathy.    She has no axillary adenopathy.  Neurological: She is alert and oriented to person, place, and time. She has normal reflexes. No cranial nerve deficit or sensory deficit.  Skin: Skin is warm, dry and intact. No rash noted.  Psychiatric: She has a normal mood and affect. Her speech is normal and behavior is normal. Thought content normal.  Nursing note and vitals reviewed.   BP 132/70   Pulse 64   Ht 5\' 7"  (1.702 m)   Wt 173 lb (78.5 kg)   LMP 04/19/2018 (Exact Date)   SpO2 99%   BMI 27.10 kg/m   Assessment and Plan: 1. Annual physical exam Normal exam Continue healthy diet - try [employer] or intermittent fasting - Lipid panel - POCT urinalysis dipstick  2. 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  Dispense: 90 tablet; Refill: 1  3. Episodic ataxia (HCC) Chronic, unchanged Followed by Neurology - CBC with Differential/Platelet  4. 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 Chronic pain of both knees Continue Advil PRN If worsening, becoming unstable, etc will need Ortho eval   Meds ordered this encounter  Medications  . levothyroxine (SYNTHROID) 112 MCG tablet    Sig: Take 1 tablet (112 mcg total) by mouth daily before breakfast.    Dispense:  90 tablet    Refill:  1    Partially dictated using Editor, commissioning. Any errors are unintentional.  Halina Maidens, MD [facility]  04/22/2018

## 2018-04-23 LAB — CBC WITH DIFFERENTIAL/PLATELET
BASOS: 1 %
Basophils Absolute: 0 10*3/uL (ref 0.0–0.2)
EOS (ABSOLUTE): 0.1 10*3/uL (ref 0.0–0.4)
Eos: 2 %
HEMATOCRIT: 40.8 % (ref 34.0–46.6)
Hemoglobin: 13.5 g/dL (ref 11.1–15.9)
Immature Grans (Abs): 0 10*3/uL (ref 0.0–0.1)
Immature Granulocytes: 0 %
LYMPHS ABS: 1.6 10*3/uL (ref 0.7–3.1)
Lymphs: 29 %
MCH: 30.8 pg (ref 26.6–33.0)
MCHC: 33.1 g/dL (ref 31.5–35.7)
MCV: 93 fL (ref 79–97)
MONOS ABS: 0.5 10*3/uL (ref 0.1–0.9)
Monocytes: 9 %
Neutrophils Absolute: 3.3 10*3/uL (ref 1.4–7.0)
Neutrophils: 59 %
PLATELETS: 201 10*3/uL (ref 150–450)
RBC: 4.39 x10E6/uL (ref 3.77–5.28)
RDW: 13 % (ref 12.3–15.4)
WBC: 5.5 10*3/uL (ref 3.4–10.8)

## 2018-04-23 LAB — LIPID PANEL
CHOL/HDL RATIO: 3.3 ratio (ref 0.0–4.4)
CHOLESTEROL TOTAL: 254 mg/dL — AB (ref 100–199)
HDL: 77 mg/dL (ref >39)
LDL Calculated: 160 mg/dL — ABNORMAL HIGH (ref 0–99)
TRIGLYCERIDES: 83 mg/dL (ref 0–149)
VLDL Cholesterol Cal: 17 mg/dL (ref 5–40)

## 2018-04-23 LAB — COMPREHENSIVE METABOLIC PANEL
A/G RATIO: 2 (ref 1.2–2.2)
ALK PHOS: 49 IU/L (ref 39–117)
ALT: 9 IU/L (ref 0–32)
AST: 17 IU/L (ref 0–40)
Albumin: 4.3 g/dL (ref 3.5–5.5)
BILIRUBIN TOTAL: 0.5 mg/dL (ref 0.0–1.2)
BUN/Creatinine Ratio: 16 (ref 9–23)
BUN: 10 mg/dL (ref 6–24)
CHLORIDE: 105 mmol/L (ref 96–106)
CO2: 20 mmol/L (ref 20–29)
Calcium: 8.8 mg/dL (ref 8.7–10.2)
Creatinine, Ser: 0.63 mg/dL (ref 0.57–1.00)
GFR calc Af Amer: 120 mL/min/{1.73_m2} (ref >59)
GFR, EST NON AFRICAN AMERICAN: 104 mL/min/{1.73_m2} (ref >59)
Globulin, Total: 2.1 g/dL (ref 1.5–4.5)
Glucose: 85 mg/dL (ref 65–99)
POTASSIUM: 4.3 mmol/L (ref 3.5–5.2)
Sodium: 140 mmol/L (ref 134–144)
Total Protein: 6.4 g/dL (ref 6.0–8.5)

## 2018-04-23 LAB — TSH: TSH: 0.572 u[IU]/mL (ref 0.450–4.500)

## 2018-04-29 ENCOUNTER — Ambulatory Visit
Admission: RE | Admit: 2018-04-29 | Discharge: 2018-04-29 | Disposition: A | Discharge status: Home / Self Care | Payer: BC Managed Care – PPO | Source: Ambulatory Visit | Attending: Internal Medicine | Admitting: Internal Medicine

## 2018-04-29 DIAGNOSIS — Z1231 Encounter for screening mammogram for malignant neoplasm of breast: Secondary | ICD-10-CM | POA: Insufficient documentation

## 2018-06-02 ENCOUNTER — Encounter: Payer: Self-pay | Admitting: Internal Medicine

## 2018-08-12 ENCOUNTER — Encounter: Payer: Self-pay | Admitting: Internal Medicine

## 2018-08-12 NOTE — Telephone Encounter (Signed)
Please advise patient message regarding thyroid dose.

## 2018-09-19 ENCOUNTER — Encounter: Payer: Self-pay | Admitting: Internal Medicine

## 2018-09-19 ENCOUNTER — Ambulatory Visit: Payer: BC Managed Care – PPO | Admitting: Internal Medicine

## 2018-09-19 VITALS — BP 130/58 | HR 64 | Ht 67.0 in | Wt 173.0 lb

## 2018-09-19 DIAGNOSIS — E034 Atrophy of thyroid (acquired): Secondary | ICD-10-CM | POA: Diagnosis not present

## 2018-09-19 NOTE — Progress Notes (Signed)
Date:  09/19/2018   Name:  Cindy Fowler   DOB:  03-07-1966   MRN:  [id number]   Chief Complaint: Hypothyroidism  Thyroid Problem  Presents for follow-up visit. Patient reports no constipation, depressed mood, fatigue, menstrual problem, palpitations, weight gain or weight loss. (She had a brief spell of elevated systolic BP to 638) The symptoms have been resolved.  She was not taking any medication or supplements that could cause her BP to rise.  She has been exercising regularly.  Wt Readings from Last 3 Encounters:  09/19/18 173 lb (78.5 kg)  04/22/18 173 lb (78.5 kg)  04/17/17 175 lb (79.4 kg)    Lab Results  Component Value Date   TSH 0.572 04/22/2018     Review of Systems  Constitutional: Negative for chills, fatigue, fever, weight gain and weight loss.  HENT: Negative for trouble swallowing.   Respiratory: Negative for chest tightness, shortness of breath and wheezing.   Cardiovascular: Negative for chest pain, palpitations and leg swelling.  Gastrointestinal: Negative for constipation.  Genitourinary: Negative for menstrual problem.  Neurological: Negative for dizziness, light-headedness and headaches.    Patient Active Problem List   Diagnosis Date Noted  . Chronic pain of both knees 04/22/2018  . Abnormal vaginal bleeding 05/05/2016  . Hyperlipidemia 04/14/2015  . Hypothyroidism 04/14/2015  . Empty sella ([place]) 04/14/2015  . 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 Arthritis 04/14/2015  . Cataract 05/11/2014  . Episodic ataxia ([place]) 04/03/2013    Allergies  Allergen Reactions  . Fluconazole Swelling    eyes  . Propoxyphene Nausea And Vomiting  . Clarithromycin Palpitations and Anxiety    Shakey, GI upset    Past Surgical History:  Procedure Laterality Date  . ADENOIDECTOMY    . CATARACT EXTRACTION Bilateral 2015  . CESAREAN SECTION  1994  . DILATION AND CURETTAGE OF UTERUS    . TONSILLECTOMY    . TUBAL  LIGATION      Social History   Tobacco Use  . Smoking status: Never Smoker  . Smokeless tobacco: Never Used  Substance Use Topics  . Alcohol use: Yes    Alcohol/week: 0.0 standard drinks  . Drug use: No     Medication list has been reviewed and updated.  Current Meds  Medication Sig  . acetaZOLAMIDE (DIAMOX) 125 MG tablet Take by mouth.  . Calcium Carbonate 1500 (600 CA) MG TABS Take 1 tablet by mouth daily at 2 PM daily at 2 PM.  . Cholecalciferol (VITAMIN D-1000 MAX ST) 1000 UNITS tablet Take 1 tablet by mouth daily at 2 PM daily at 2 PM.  . levothyroxine (SYNTHROID) 112 MCG tablet Take 1 tablet (112 mcg total) by mouth daily before breakfast.  . Melatonin 3 MG TABS Take 1 tablet by mouth at bedtime.  . MULTIPLE VITAMIN PO Take by mouth.  . venlafaxine XR (EFFEXOR-XR) 75 MG 24 hr capsule Take by mouth.    PHQ 2/9 Scores 04/22/2018 04/17/2017  PHQ - 2 Score 0 0    Physical Exam Vitals signs and nursing note reviewed.  Constitutional:      General: She is not in acute distress.    Appearance: She is well-developed.  HENT:     Head: Normocephalic and atraumatic.  Eyes:     Extraocular Movements: Extraocular movements intact.     Pupils: Pupils are equal, round, and reactive to light.  Cardiovascular:     Rate and Rhythm: Normal rate and regular  rhythm.     Pulses: Normal pulses.     Heart sounds: Normal heart sounds.  Pulmonary:     Effort: Pulmonary effort is normal. No respiratory distress.  Musculoskeletal: Normal range of motion.     Right lower leg: No edema.     Left lower leg: No edema.  Skin:    General: Skin is warm and dry.     Findings: No rash.  Neurological:     Mental Status: She is alert and oriented to person, place, and time.  Psychiatric:        Behavior: Behavior normal.        Thought Content: Thought content normal.     BP (!) 130/58 (BP Location: Right Arm, Patient Position: Sitting, Cuff Size: Normal)   Pulse 64   Ht 5\' 7"  (1.702 m)    Wt 173 lb (78.5 kg)   SpO2 97%   BMI 27.10 kg/m   Assessment and Plan: 1. Hypothyroidism due to acquired atrophy of thyroid Continue current dose (1/2 tab on Sundays) Monitor BP weekly - normal today - Thyroid Panel With TSH   Partially dictated using Dragon software. Any errors are unintentional.  Halina Maidens, MD [facility]  09/19/2018

## 2018-09-20 LAB — THYROID PANEL WITH TSH
Free Thyroxine Index: 1.9 (ref 1.2–4.9)
T3 Uptake Ratio: 27 % (ref 24–39)
T4, Total: 7.2 ug/dL (ref 4.5–12.0)
TSH: 1.19 u[IU]/mL (ref 0.450–4.500)

## 2019-01-14 ENCOUNTER — Encounter: Payer: Self-pay | Admitting: Internal Medicine

## 2019-01-20 ENCOUNTER — Encounter: Payer: Self-pay | Admitting: Internal Medicine

## 2019-01-20 ENCOUNTER — Other Ambulatory Visit: Payer: Self-pay

## 2019-01-20 ENCOUNTER — Ambulatory Visit: Payer: BC Managed Care – PPO | Admitting: Internal Medicine

## 2019-01-20 VITALS — BP 126/74 | HR 68 | Ht 67.0 in | Wt 179.0 lb

## 2019-01-20 DIAGNOSIS — Z860100 Personal history of colon polyps, unspecified: Secondary | ICD-10-CM | POA: Insufficient documentation

## 2019-01-20 DIAGNOSIS — R030 Elevated blood-pressure reading, without diagnosis of hypertension: Secondary | ICD-10-CM

## 2019-01-20 DIAGNOSIS — E034 Atrophy of thyroid (acquired): Secondary | ICD-10-CM

## 2019-01-20 DIAGNOSIS — N939 Abnormal uterine and vaginal bleeding, unspecified: Secondary | ICD-10-CM | POA: Diagnosis not present

## 2019-01-20 DIAGNOSIS — D126 Benign neoplasm of colon, unspecified: Secondary | ICD-10-CM | POA: Diagnosis not present

## 2019-01-20 NOTE — Progress Notes (Signed)
Date:  01/20/2019   Name:  Cindy Fowler   DOB:  Jul 11, 1966   MRN:  [id number]   Chief Complaint: Hypothyroidism (Thyroid check .)  Thyroid Problem  Presents for follow-up visit. Symptoms include constipation, fatigue, menstrual problem (now taking provera) and weight gain. Patient reports no anxiety, heat intolerance or palpitations. (She has been more fatigued lately - still active and exercising but does not feel as energetic)  Elevated BP - noted on last visit to have several elevated pressures.  Pt has been monitoring at home.  Readings have been normal. Colon Polyp - noted in 2015.  She is due for repeat study this year with Dr. Comer Locket.   Lab Results  Component Value Date   TSH 1.190 09/19/2018   T4TOTAL 7.2 09/19/2018    Review of Systems  Constitutional: Positive for fatigue, unexpected weight change and weight gain. Negative for chills.  HENT: Negative for trouble swallowing.   Eyes: Negative for visual disturbance.  Respiratory: Negative for cough, chest tightness, shortness of breath and wheezing.   Cardiovascular: Negative for chest pain, palpitations and leg swelling.  Gastrointestinal: Positive for constipation. Negative for abdominal pain.  Endocrine: Negative for heat intolerance.  Genitourinary: Positive for menstrual problem (now taking provera).  Musculoskeletal: Positive for myalgias (intermittent muscle soreness). Negative for arthralgias, gait problem and joint swelling.  Skin: Negative for color change and rash.  Neurological: Positive for dizziness and headaches. Negative for weakness, light-headedness and numbness.  Psychiatric/Behavioral: Negative for dysphoric mood and sleep disturbance. The patient is not nervous/anxious.     Patient Active Problem List   Diagnosis Date Noted  . Adenomatous colon polyp 01/20/2019  . Chronic pain of both knees 04/22/2018  . Abnormal vaginal bleeding 05/05/2016  . Hyperlipidemia 04/14/2015  .  Hypothyroidism 04/14/2015  . Empty sella ([place]) 04/14/2015  . 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 Arthritis 04/14/2015  . Cataract 05/11/2014  . 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 Propoxyphene Nausea And Vomiting  . Fluconazole Swelling    eyes  . Clarithromycin Palpitations and Anxiety    Shakey, GI upset    Past Surgical History:  Procedure Laterality Date  . ADENOIDECTOMY    . CATARACT EXTRACTION Bilateral 2015  . CESAREAN SECTION  1994  . DILATION AND CURETTAGE OF UTERUS    . TONSILLECTOMY    . TUBAL LIGATION      Social History   Tobacco Use  . Smoking status: Never Smoker  . Smokeless tobacco: Never Used  Substance Use Topics  . Alcohol use: Yes    Alcohol/week: 0.0 standard drinks  . Drug use: No     Medication list has been reviewed and updated.  Current Meds  Medication Sig  . acetaZOLAMIDE (DIAMOX) 125 MG tablet Take by mouth.  . Calcium Carbonate 1500 (600 CA) MG TABS Take 1 tablet by mouth daily at 2 PM daily at 2 PM.  . levothyroxine (SYNTHROID) 112 MCG tablet Take 1 tablet (112 mcg total) by mouth daily before breakfast.  . Melatonin 3 MG TABS Take 1 tablet by mouth at bedtime.  . MULTIPLE VITAMIN PO Take by mouth.  . norethindrone (AYGESTIN) 5 MG tablet Take 2.5 mg by mouth daily. Taking 5 mg for breakthrough bleeding.  . venlafaxine XR (EFFEXOR-XR) 75 MG 24 hr capsule Take by mouth.    PHQ 2/9 Scores 01/20/2019 01/20/2019 04/22/2018 04/17/2017  PHQ - 2 Score 1 0 0  0    BP Readings from Last 3 Encounters:  01/20/19 126/74  09/19/18 (!) 130/58  04/22/18 132/70    Physical Exam Vitals signs and nursing note reviewed.  Constitutional:      General: She is not in acute distress.    Appearance: She is well-developed.  HENT:     Head: Normocephalic and atraumatic.     Mouth/Throat:     Mouth: Mucous membranes are moist.  Eyes:     Pupils: Pupils are equal, round, and  reactive to light.  Neck:     Musculoskeletal: Normal range of motion and neck supple.  Cardiovascular:     Rate and Rhythm: Regular rhythm. Tachycardia present.     Pulses: Normal pulses.     Heart sounds: No murmur.  Pulmonary:     Effort: Pulmonary effort is normal. No respiratory distress.     Breath sounds: No wheezing or rhonchi.  Musculoskeletal: Normal range of motion.     Right lower leg: No edema.     Left lower leg: No edema.  Skin:    General: Skin is warm and dry.     Findings: No rash.  Neurological:     Mental Status: She is alert and oriented to person, place, and time.  Psychiatric:        Behavior: Behavior normal.        Thought Content: Thought content normal.     Wt Readings from Last 3 Encounters:  01/20/19 179 lb (81.2 kg)  09/19/18 173 lb (78.5 kg)  04/22/18 173 lb (78.5 kg)    BP 126/74   Pulse 68   Ht 5\' 7"  (1.702 m)   Wt 179 lb (81.2 kg)   LMP 10/09/2018 (Exact Date)   SpO2 98%   BMI 28.04 kg/m   Assessment and Plan: 1. Hypothyroidism due to acquired atrophy of thyroid Check labs and adjust dose if needed Sx may be due to underdosing - T3, free - TSH + free T4  2. Elevated blood pressure reading normalized - Comprehensive metabolic panel  3. Abnormal vaginal bleeding Now on Provera with good control - rule out anemia - CBC with Differential/Platelet  4. Adenomatous polyp of colon, unspecified part of colon Due for colonoscopy this year   Partially dictated using Editor, commissioning. Any errors are unintentional.  Halina Maidens, MD [facility]  01/20/2019

## 2019-01-21 LAB — CBC WITH DIFFERENTIAL/PLATELET
Basophils Absolute: 0.1 10*3/uL (ref 0.0–0.2)
Basos: 1 %
EOS (ABSOLUTE): 0.2 10*3/uL (ref 0.0–0.4)
Eos: 3 %
Hematocrit: 43.2 % (ref 34.0–46.6)
Hemoglobin: 15.2 g/dL (ref 11.1–15.9)
Immature Grans (Abs): 0 10*3/uL (ref 0.0–0.1)
Immature Granulocytes: 0 %
Lymphocytes Absolute: 1.9 10*3/uL (ref 0.7–3.1)
Lymphs: 29 %
MCH: 31.2 pg (ref 26.6–33.0)
MCHC: 35.2 g/dL (ref 31.5–35.7)
MCV: 89 fL (ref 79–97)
Monocytes Absolute: 0.6 10*3/uL (ref 0.1–0.9)
Monocytes: 9 %
Neutrophils Absolute: 3.8 10*3/uL (ref 1.4–7.0)
Neutrophils: 58 %
Platelets: 206 10*3/uL (ref 150–450)
RBC: 4.87 x10E6/uL (ref 3.77–5.28)
RDW: 13.4 % (ref 11.7–15.4)
WBC: 6.6 10*3/uL (ref 3.4–10.8)

## 2019-01-21 LAB — COMPREHENSIVE METABOLIC PANEL
ALT: 12 IU/L (ref 0–32)
AST: 19 IU/L (ref 0–40)
Albumin/Globulin Ratio: 2.1 (ref 1.2–2.2)
Albumin: 4.8 g/dL (ref 3.8–4.9)
Alkaline Phosphatase: 44 IU/L (ref 39–117)
BUN/Creatinine Ratio: 11 (ref 9–23)
BUN: 9 mg/dL (ref 6–24)
Bilirubin Total: 0.5 mg/dL (ref 0.0–1.2)
CO2: 18 mmol/L — ABNORMAL LOW (ref 20–29)
Calcium: 9.7 mg/dL (ref 8.7–10.2)
Chloride: 107 mmol/L — ABNORMAL HIGH (ref 96–106)
Creatinine, Ser: 0.8 mg/dL (ref 0.57–1.00)
GFR calc Af Amer: 98 mL/min/{1.73_m2} (ref >59)
GFR calc non Af Amer: 85 mL/min/{1.73_m2} (ref >59)
Globulin, Total: 2.3 g/dL (ref 1.5–4.5)
Glucose: 99 mg/dL (ref 65–99)
Potassium: 4.5 mmol/L (ref 3.5–5.2)
Sodium: 140 mmol/L (ref 134–144)
Total Protein: 7.1 g/dL (ref 6.0–8.5)

## 2019-01-21 LAB — TSH+FREE T4
Free T4: 1.27 ng/dL (ref 0.82–1.77)
TSH: 1.36 u[IU]/mL (ref 0.450–4.500)

## 2019-01-21 LAB — T3, FREE: T3, Free: 2.4 pg/mL (ref 2.0–4.4)

## 2019-01-23 ENCOUNTER — Encounter: Payer: Self-pay | Admitting: Internal Medicine

## 2019-01-23 ENCOUNTER — Other Ambulatory Visit: Payer: Self-pay | Admitting: Internal Medicine

## 2019-01-23 DIAGNOSIS — H1033 Unspecified acute conjunctivitis, bilateral: Secondary | ICD-10-CM

## 2019-01-23 MED ORDER — NEOMYCIN-POLYMYXIN-DEXAMETH 3.5-10000-0.1 OP SUSP: 2.0000 [drp] | Freq: Four times a day (QID) | OPHTHALMIC | 0 refills | Status: AC

## 2019-01-30 ENCOUNTER — Other Ambulatory Visit: Payer: Self-pay | Admitting: Internal Medicine

## 2019-01-30 DIAGNOSIS — E034 Atrophy of thyroid (acquired): Secondary | ICD-10-CM

## 2019-01-31 ENCOUNTER — Other Ambulatory Visit: Payer: Self-pay | Admitting: Internal Medicine

## 2019-01-31 ENCOUNTER — Encounter: Payer: Self-pay | Admitting: Internal Medicine

## 2019-01-31 MED ORDER — ERYTHROMYCIN 5 MG/GM OP OINT: 1.0000 "application " | TOPICAL_OINTMENT | Freq: Every day | OPHTHALMIC | 0 refills | Status: AC

## 2019-01-31 NOTE — Telephone Encounter (Signed)
Please Advise patient message.

## 2019-01-31 NOTE — Telephone Encounter (Signed)
Patient sent picture. Please advise.

## 2019-03-19 ENCOUNTER — Other Ambulatory Visit: Payer: Self-pay | Admitting: Internal Medicine

## 2019-03-19 DIAGNOSIS — Z1231 Encounter for screening mammogram for malignant neoplasm of breast: Secondary | ICD-10-CM

## 2019-04-23 ENCOUNTER — Encounter: Payer: BC Managed Care – PPO | Admitting: Internal Medicine

## 2019-04-24 ENCOUNTER — Encounter: Payer: Self-pay | Admitting: Internal Medicine

## 2019-04-24 ENCOUNTER — Ambulatory Visit (INDEPENDENT_AMBULATORY_CARE_PROVIDER_SITE_OTHER): Payer: BC Managed Care – PPO | Admitting: Internal Medicine

## 2019-04-24 ENCOUNTER — Other Ambulatory Visit: Payer: Self-pay

## 2019-04-24 VITALS — BP 102/66 | HR 67 | Ht 67.0 in | Wt 180.0 lb

## 2019-04-24 DIAGNOSIS — E236 Other disorders of pituitary gland: Secondary | ICD-10-CM

## 2019-04-24 DIAGNOSIS — G43109 Migraine with aura, not intractable, without status migrainosus: Secondary | ICD-10-CM

## 2019-04-24 DIAGNOSIS — Z23 Encounter for immunization: Secondary | ICD-10-CM

## 2019-04-24 DIAGNOSIS — E034 Atrophy of thyroid (acquired): Secondary | ICD-10-CM | POA: Diagnosis not present

## 2019-04-24 DIAGNOSIS — D126 Benign neoplasm of colon, unspecified: Secondary | ICD-10-CM

## 2019-04-24 DIAGNOSIS — E782 Mixed hyperlipidemia: Secondary | ICD-10-CM | POA: Diagnosis not present

## 2019-04-24 DIAGNOSIS — Z0000 Encounter for general adult medical examination without abnormal findings: Secondary | ICD-10-CM | POA: Diagnosis not present

## 2019-04-24 LAB — POCT URINALYSIS DIPSTICK
Bilirubin, UA: NEGATIVE
Blood, UA: NEGATIVE
Glucose, UA: NEGATIVE
Ketones, UA: NEGATIVE
Leukocytes, UA: NEGATIVE
Nitrite, UA: NEGATIVE
Protein, UA: NEGATIVE
Spec Grav, UA: 1.01 (ref 1.010–1.025)
Urobilinogen, UA: 0.2 E.U./dL
pH, UA: 7.5 (ref 5.0–8.0)

## 2019-04-24 NOTE — Progress Notes (Signed)
Date:  04/24/2019   Name:  Cindy Fowler   DOB:  1966/08/04   MRN:  [id number]   Chief Complaint: Annual Exam (No pap or breast exam.) Cindy Fowler is a 53 y.o. female who presents today for her Complete Annual Exam. She feels well. She reports exercising regularly walking, swimming and yoga. She reports she is sleeping well.   Mammogram scheduled 04/2019 Colonoscopy 12/2013 - due for repeat ( report shows hyperplastic polyp - but has family history of TA) Pap 03/2018  Thyroid Problem Presents for follow-up visit. Symptoms include menstrual problem (menses irregular - skipped several months before a cycle last month). Patient reports no anxiety, constipation, depressed mood, diarrhea, fatigue, leg swelling, palpitations, tremors or weight gain. The symptoms have been stable.  Migraine  This is a recurrent problem. The problem occurs intermittently. The pain quality is similar to prior headaches (seen by Neurology for basilar artery migraine). Pertinent negatives include no abdominal pain, coughing, dizziness, fever, hearing loss, tinnitus or vomiting. Treatments tried: effexor and diamox.   Lab Results  Component Value Date   TSH 1.360 01/20/2019     Review of Systems  Constitutional: Negative for chills, fatigue, fever and weight gain.  HENT: Negative for congestion, hearing loss, tinnitus, trouble swallowing and voice change.   Eyes: Negative for visual disturbance.  Respiratory: Negative for cough, choking, chest tightness, shortness of breath and wheezing.   Cardiovascular: Negative for chest pain, palpitations and leg swelling.  Gastrointestinal: Negative for abdominal pain, constipation, diarrhea and vomiting.  Endocrine: Negative for polydipsia and polyuria.  Genitourinary: Positive for menstrual problem (menses irregular - skipped several months before a cycle last month). Negative for dysuria, frequency, genital sores, vaginal bleeding and vaginal discharge.   Musculoskeletal: Negative for arthralgias, gait problem and joint swelling.  Skin: Negative for color change and rash.  Neurological: Positive for headaches (unchanged). Negative for dizziness, tremors and light-headedness.  Hematological: Negative for adenopathy. Does not bruise/bleed easily.  Psychiatric/Behavioral: Positive for sleep disturbance (intermittent). Negative for dysphoric mood. The patient is not nervous/anxious.     Patient Active Problem List   Diagnosis Date Noted  . Adenomatous colon polyp 01/20/2019  . Chronic pain of both knees 04/22/2018  . Abnormal vaginal bleeding 05/05/2016  . Hyperlipidemia 04/14/2015  . Hypothyroidism 04/14/2015  . Empty sella ([place]) 04/14/2015  . 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 Arthritis 04/14/2015  . Cataract 05/11/2014  . 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 Propoxyphene Nausea And Vomiting  . Fluconazole Swelling    eyes  . Clarithromycin Palpitations and Anxiety    Shakey, GI upset    Past Surgical History:  Procedure Laterality Date  . ADENOIDECTOMY    . CATARACT EXTRACTION Bilateral 2015  . CESAREAN SECTION  1994  . DILATION AND CURETTAGE OF UTERUS    . TONSILLECTOMY    . TUBAL LIGATION      Social History   Tobacco Use  . Smoking status: Never Smoker  . Smokeless tobacco: Never Used  Substance Use Topics  . Alcohol use: Yes    Alcohol/week: 0.0 standard drinks  . Drug use: No     Medication list has been reviewed and updated.  Current Meds  Medication Sig  . acetaZOLAMIDE (DIAMOX) 125 MG tablet Take by mouth.  . Calcium Carbonate 1500 (600 CA) MG TABS Take 1 tablet by mouth daily at 2 PM daily at 2 PM.  .  levothyroxine (SYNTHROID) 112 MCG tablet TAKE 1 TABLET(112 MCG) BY MOUTH DAILY BEFORE BREAKFAST  . Melatonin 3 MG TABS Take 1 tablet by mouth at bedtime.  . MULTIPLE VITAMIN PO Take by mouth.  . venlafaxine XR (EFFEXOR-XR) 75 MG 24 hr  capsule Take by mouth.    PHQ 2/9 Scores 04/24/2019 01/20/2019 01/20/2019 04/22/2018  PHQ - 2 Score 0 1 0 0    BP Readings from Last 3 Encounters:  04/24/19 102/66  01/20/19 126/74  09/19/18 (!) 130/58    Physical Exam Vitals signs and nursing note reviewed.  Constitutional:      General: She is not in acute distress.    Appearance: She is well-developed.  HENT:     Head: Normocephalic and atraumatic.     Right Ear: Tympanic membrane and ear canal normal.     Left Ear: Tympanic membrane and ear canal normal.     Nose:     Right Sinus: No maxillary sinus tenderness.     Left Sinus: No maxillary sinus tenderness.  Eyes:     General: No scleral icterus.       Right eye: No discharge.        Left eye: No discharge.     Conjunctiva/sclera: Conjunctivae normal.  Neck:     Musculoskeletal: Normal range of motion. No erythema.     Thyroid: No thyromegaly.     Vascular: No carotid bruit.  Cardiovascular:     Rate and Rhythm: Normal rate and regular rhythm.     Pulses: Normal pulses.     Heart sounds: Normal heart sounds.  Pulmonary:     Effort: Pulmonary effort is normal. No respiratory distress.     Breath sounds: No wheezing.  Abdominal:     General: Bowel sounds are normal.     Palpations: Abdomen is soft.     Tenderness: There is no abdominal tenderness.  Musculoskeletal: Normal range of motion.     Right lower leg: No edema.     Left lower leg: No edema.  Lymphadenopathy:     Cervical: No cervical adenopathy.  Skin:    General: Skin is warm and dry.     Capillary Refill: Capillary refill takes less than 2 seconds.     Findings: No rash.  Neurological:     Mental Status: She is alert and oriented to person, place, and time.     Cranial Nerves: No cranial nerve deficit.     Sensory: No sensory deficit.     Deep Tendon Reflexes: Reflexes are normal and symmetric.  Psychiatric:        Attention and Perception: Attention normal.        Mood and Affect: Mood normal.         Speech: Speech normal.        Behavior: Behavior normal.        Thought Content: Thought content normal.     Wt Readings from Last 3 Encounters:  04/24/19 180 lb (81.6 kg)  01/20/19 179 lb (81.2 kg)  09/19/18 173 lb (78.5 kg)    BP 102/66   Pulse 67   Ht 5\' 7"  (1.702 m)   Wt 180 lb (81.6 kg)   LMP 03/04/2019 (Exact Date)   SpO2 98%   BMI 28.19 kg/m   Assessment and Plan: 1. Annual physical exam Normal exam Continue healthy diet and exercise - POCT urinalysis dipstick  2. 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  Mixed hyperlipidemia Check labs; continue lifestyle changes - Lipid panel  4. 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 Adenomatous polyp of colon, unspecified part of colon Will send referral to GI so they can determine if she is due for repeat - Ambulatory referral to Gastroenterology  7. Need for diphtheria-tetanus-pertussis (Tdap) vaccine - Tdap vaccine greater than or equal to 7yo IM   Partially dictated using Editor, commissioning. Any errors are unintentional.  Halina Maidens, MD [facility]  04/24/2019

## 2019-04-25 LAB — CBC WITH DIFFERENTIAL/PLATELET
Basophils Absolute: 0.1 10*3/uL (ref 0.0–0.2)
Basos: 1 %
EOS (ABSOLUTE): 0.2 10*3/uL (ref 0.0–0.4)
Eos: 4 %
Hematocrit: 43.2 % (ref 34.0–46.6)
Hemoglobin: 14.7 g/dL (ref 11.1–15.9)
Immature Grans (Abs): 0 10*3/uL (ref 0.0–0.1)
Immature Granulocytes: 0 %
Lymphocytes Absolute: 1.5 10*3/uL (ref 0.7–3.1)
Lymphs: 30 %
MCH: 31.7 pg (ref 26.6–33.0)
MCHC: 34 g/dL (ref 31.5–35.7)
MCV: 93 fL (ref 79–97)
Monocytes Absolute: 0.4 10*3/uL (ref 0.1–0.9)
Monocytes: 9 %
Neutrophils Absolute: 2.7 10*3/uL (ref 1.4–7.0)
Neutrophils: 56 %
Platelets: 162 10*3/uL (ref 150–450)
RBC: 4.63 x10E6/uL (ref 3.77–5.28)
RDW: 12.7 % (ref 11.7–15.4)
WBC: 4.9 10*3/uL (ref 3.4–10.8)

## 2019-04-25 LAB — COMPREHENSIVE METABOLIC PANEL
ALT: 15 IU/L (ref 0–32)
AST: 18 IU/L (ref 0–40)
Albumin/Globulin Ratio: 2 (ref 1.2–2.2)
Albumin: 4.5 g/dL (ref 3.8–4.9)
Alkaline Phosphatase: 53 IU/L (ref 39–117)
BUN/Creatinine Ratio: 16 (ref 9–23)
BUN: 13 mg/dL (ref 6–24)
Bilirubin Total: 0.6 mg/dL (ref 0.0–1.2)
CO2: 19 mmol/L — ABNORMAL LOW (ref 20–29)
Calcium: 9.3 mg/dL (ref 8.7–10.2)
Chloride: 106 mmol/L (ref 96–106)
Creatinine, Ser: 0.79 mg/dL (ref 0.57–1.00)
GFR calc Af Amer: 100 mL/min/{1.73_m2} (ref >59)
GFR calc non Af Amer: 86 mL/min/{1.73_m2} (ref >59)
Globulin, Total: 2.2 g/dL (ref 1.5–4.5)
Glucose: 97 mg/dL (ref 65–99)
Potassium: 4.2 mmol/L (ref 3.5–5.2)
Sodium: 141 mmol/L (ref 134–144)
Total Protein: 6.7 g/dL (ref 6.0–8.5)

## 2019-04-25 LAB — LIPID PANEL
Chol/HDL Ratio: 3.8 ratio (ref 0.0–4.4)
Cholesterol, Total: 269 mg/dL — ABNORMAL HIGH (ref 100–199)
HDL: 71 mg/dL (ref >39)
LDL Calculated: 175 mg/dL — ABNORMAL HIGH (ref 0–99)
Triglycerides: 117 mg/dL (ref 0–149)
VLDL Cholesterol Cal: 23 mg/dL (ref 5–40)

## 2019-04-25 LAB — TSH+FREE T4
Free T4: 1.41 ng/dL (ref 0.82–1.77)
TSH: 0.973 u[IU]/mL (ref 0.450–4.500)

## 2019-04-28 ENCOUNTER — Encounter: Payer: Self-pay | Admitting: Internal Medicine

## 2019-05-05 ENCOUNTER — Other Ambulatory Visit: Payer: Self-pay

## 2019-05-05 ENCOUNTER — Ambulatory Visit
Admission: RE | Admit: 2019-05-05 | Discharge: 2019-05-05 | Disposition: A | Discharge status: Home / Self Care | Payer: BC Managed Care – PPO | Source: Ambulatory Visit | Attending: Internal Medicine | Admitting: Internal Medicine

## 2019-05-05 DIAGNOSIS — Z1231 Encounter for screening mammogram for malignant neoplasm of breast: Secondary | ICD-10-CM | POA: Insufficient documentation

## 2019-05-07 ENCOUNTER — Encounter: Payer: BC Managed Care – PPO | Admitting: Internal Medicine

## 2019-07-29 ENCOUNTER — Other Ambulatory Visit: Payer: Self-pay | Admitting: Internal Medicine

## 2019-07-29 DIAGNOSIS — E034 Atrophy of thyroid (acquired): Secondary | ICD-10-CM

## 2019-10-20 ENCOUNTER — Other Ambulatory Visit: Payer: Self-pay | Admitting: Internal Medicine

## 2019-10-20 DIAGNOSIS — E034 Atrophy of thyroid (acquired): Secondary | ICD-10-CM

## 2020-03-24 ENCOUNTER — Other Ambulatory Visit: Payer: Self-pay | Admitting: Internal Medicine

## 2020-04-12 ENCOUNTER — Ambulatory Visit: Payer: BC Managed Care – PPO | Admitting: Internal Medicine

## 2020-04-12 ENCOUNTER — Other Ambulatory Visit: Payer: Self-pay

## 2020-04-12 ENCOUNTER — Encounter: Payer: Self-pay | Admitting: Internal Medicine

## 2020-04-12 VITALS — BP 132/78 | HR 73 | Temp 97.7°F | Ht 67.0 in | Wt 181.0 lb

## 2020-04-12 DIAGNOSIS — M25542 Pain in joints of left hand: Secondary | ICD-10-CM

## 2020-04-12 DIAGNOSIS — M25541 Pain in joints of right hand: Secondary | ICD-10-CM

## 2020-04-12 DIAGNOSIS — Z0000 Encounter for general adult medical examination without abnormal findings: Secondary | ICD-10-CM

## 2020-04-12 DIAGNOSIS — E034 Atrophy of thyroid (acquired): Secondary | ICD-10-CM

## 2020-04-12 NOTE — Progress Notes (Signed)
Date:  04/12/2020   Name:  Cindy Fowler   DOB:  June 27, 1966   MRN:  [id number]   Chief Complaint: Muscle Pain (X1 month,more joint pain, both hands and both ankles sore,right hand swollen x1 week ago pinky wont bend right, fatigue, hurts to walk)  Hand Pain  There was no injury mechanism. The pain is present in the left fingers, right fingers and right wrist. The quality of the pain is described as aching (and morning stiffness). The pain is mild. The pain has been worsening since the incident. Pertinent negatives include no chest pain. She has tried NSAIDs for the symptoms. The treatment provided mild relief.  Ankle Pain  There was no injury mechanism. The pain is present in the right ankle and left knee. The pain is mild. The pain has been fluctuating (esp stiffness in the morning) since onset. The symptoms are aggravated by weight bearing. She has tried NSAIDs for the symptoms. The treatment provided mild relief.    Lab Results  Component Value Date   CREATININE 0.79 04/24/2019   BUN 13 04/24/2019   NA 141 04/24/2019   K 4.2 04/24/2019   CL 106 04/24/2019   CO2 19 (L) 04/24/2019   Lab Results  Component Value Date   CHOL 269 (H) 04/24/2019   HDL 71 04/24/2019   LDLCALC 175 (H) 04/24/2019   TRIG 117 04/24/2019   CHOLHDL 3.8 04/24/2019   Lab Results  Component Value Date   TSH 0.973 04/24/2019   Lab Results  Component Value Date   HGBA1C 5.3 04/17/2017   Lab Results  Component Value Date   WBC 4.9 04/24/2019   HGB 14.7 04/24/2019   HCT 43.2 04/24/2019   MCV 93 04/24/2019   PLT 162 04/24/2019   Lab Results  Component Value Date   ALT 15 04/24/2019   AST 18 04/24/2019   ALKPHOS 53 04/24/2019   BILITOT 0.6 04/24/2019     Review of Systems  Constitutional: Positive for fatigue. Negative for chills and fever.  Respiratory: Negative for chest tightness and shortness of breath.   Cardiovascular: Negative for chest pain and palpitations.    Musculoskeletal: Positive for arthralgias, gait problem and joint swelling.  Neurological: Negative for dizziness and headaches.    Patient Active Problem List   Diagnosis Date Noted  . History of colonic polyps 01/20/2019  . Chronic pain of both knees 04/22/2018  . Abnormal vaginal bleeding 05/05/2016  . Hyperlipidemia 04/14/2015  . Hypothyroidism 04/14/2015  . Empty sella ([place]) 04/14/2015  . 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 Arthritis 04/14/2015  . Cataract 05/11/2014    Allergies  Allergen Reactions  . Propoxyphene Nausea And Vomiting  . Fluconazole Swelling    eyes  . Clarithromycin Palpitations and Anxiety    Shakey, GI upset    Past Surgical History:  Procedure Laterality Date  . ADENOIDECTOMY    . CATARACT EXTRACTION Bilateral 2015  . CESAREAN SECTION  1994  . DILATION AND CURETTAGE OF UTERUS    . TONSILLECTOMY    . TUBAL LIGATION      Social History   Tobacco Use  . Smoking status: Never Smoker  . Smokeless tobacco: Never Used  Vaping Use  . Vaping Use: Never used  Substance Use Topics  . Alcohol use: Yes    Alcohol/week: 0.0 standard drinks  . Drug use: No     Medication list has been reviewed and updated.  Current Meds  Medication  Sig  . acetaZOLAMIDE (DIAMOX) 125 MG tablet Take by mouth.  . Calcium Carbonate 1500 (600 CA) MG TABS Take 1 tablet by mouth daily at 2 PM daily at 2 PM.  . levothyroxine (SYNTHROID) 112 MCG tablet TAKE 1 TABLET(112 MCG) BY MOUTH DAILY BEFORE BREAKFAST  . Melatonin 3 MG TABS Take 1 tablet by mouth at bedtime.  . MULTIPLE VITAMIN PO Take by mouth.  . naproxen sodium (ALEVE) 220 MG tablet Take 220 mg by mouth every 12 (twelve) hours. Muscle pain  . venlafaxine XR (EFFEXOR-XR) 75 MG 24 hr capsule Take by mouth.    PHQ 2/9 Scores 04/12/2020 04/24/2019 01/20/2019 01/20/2019  PHQ - 2 Score 0 0 1 0  PHQ- 9 Score 0 - - -    GAD 7 : Generalized Anxiety Score 04/12/2020  Nervous, Anxious,  on Edge 0  Control/stop worrying 0  Worry too much - different things 0  Trouble relaxing 0  Restless 0  Easily annoyed or irritable 0  Afraid - awful might happen 0  Total GAD 7 Score 0  Anxiety Difficulty Not difficult at all    BP Readings from Last 3 Encounters:  04/12/20 132/78  04/24/19 102/66  01/20/19 126/74    Physical Exam Vitals and nursing note reviewed.  Constitutional:      General: She is not in acute distress.    Appearance: She is well-developed.  HENT:     Head: Normocephalic and atraumatic.  Cardiovascular:     Rate and Rhythm: Normal rate and regular rhythm.     Pulses: Normal pulses.  Pulmonary:     Effort: Pulmonary effort is normal. No respiratory distress.     Breath sounds: No wheezing or rhonchi.  Musculoskeletal:     Right wrist: Tenderness present. Decreased range of motion.     Left wrist: No tenderness. Normal range of motion.     Cervical back: Normal range of motion.     Right lower leg: No edema.     Left lower leg: No edema.     Right ankle: No swelling or deformity. Tenderness present. Decreased range of motion.     Left ankle: No swelling or deformity. Tenderness present. Decreased range of motion.     Comments: Mild synovitis of MCPs and PIPs both hands Mild synovitis of right wrist  Lymphadenopathy:     Cervical: No cervical adenopathy.  Skin:    General: Skin is warm and dry.     Findings: No rash.  Neurological:     Mental Status: She is alert and oriented to person, place, and time.  Psychiatric:        Behavior: Behavior normal.        Thought Content: Thought content normal.     Wt Readings from Last 3 Encounters:  04/12/20 181 lb (82.1 kg)  04/24/19 180 lb (81.6 kg)  01/20/19 179 lb (81.2 kg)    BP 132/78   Pulse 73   Temp 97.7 F (36.5 C) (Oral)   Ht 5\' 7"  (1.702 m)   Wt 181 lb (82.1 kg)   SpO2 96%   BMI 28.35 kg/m   Assessment and Plan: 1. Arthralgia of both hands Suspicious for RA (mother has  RA) Continue Aleve or Advil bid for now - ANA w/Reflex if Positive - Rheumatoid factor - CYCLIC CITRUL PEPTIDE ANTIBODY, IGG/IGA - CBC with Differential/Platelet - Comprehensive metabolic panel  2. 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  Routine medical exam - Lipid panel   Partially dictated using Dragon software. Any errors are unintentional.  Halina Maidens, MD [facility]  04/12/2020

## 2020-04-14 ENCOUNTER — Other Ambulatory Visit: Payer: Self-pay

## 2020-04-14 DIAGNOSIS — M2550 Pain in unspecified joint: Secondary | ICD-10-CM

## 2020-04-14 LAB — COMPREHENSIVE METABOLIC PANEL
ALT: 18 IU/L (ref 0–32)
AST: 20 IU/L (ref 0–40)
Albumin/Globulin Ratio: 2 (ref 1.2–2.2)
Albumin: 4.5 g/dL (ref 3.8–4.9)
Alkaline Phosphatase: 74 IU/L (ref 48–121)
BUN/Creatinine Ratio: 18 (ref 9–23)
BUN: 14 mg/dL (ref 6–24)
Bilirubin Total: 0.5 mg/dL (ref 0.0–1.2)
CO2: 21 mmol/L (ref 20–29)
Calcium: 9.3 mg/dL (ref 8.7–10.2)
Chloride: 104 mmol/L (ref 96–106)
Creatinine, Ser: 0.78 mg/dL (ref 0.57–1.00)
GFR calc Af Amer: 100 mL/min/{1.73_m2} (ref >59)
GFR calc non Af Amer: 87 mL/min/{1.73_m2} (ref >59)
Globulin, Total: 2.2 g/dL (ref 1.5–4.5)
Glucose: 95 mg/dL (ref 65–99)
Potassium: 4.4 mmol/L (ref 3.5–5.2)
Sodium: 140 mmol/L (ref 134–144)
Total Protein: 6.7 g/dL (ref 6.0–8.5)

## 2020-04-14 LAB — CBC WITH DIFFERENTIAL/PLATELET
Basophils Absolute: 0 10*3/uL (ref 0.0–0.2)
Basos: 1 %
EOS (ABSOLUTE): 0.2 10*3/uL (ref 0.0–0.4)
Eos: 3 %
Hematocrit: 44.5 % (ref 34.0–46.6)
Hemoglobin: 15.1 g/dL (ref 11.1–15.9)
Immature Grans (Abs): 0 10*3/uL (ref 0.0–0.1)
Immature Granulocytes: 0 %
Lymphocytes Absolute: 1.5 10*3/uL (ref 0.7–3.1)
Lymphs: 28 %
MCH: 31.1 pg (ref 26.6–33.0)
MCHC: 33.9 g/dL (ref 31.5–35.7)
MCV: 92 fL (ref 79–97)
Monocytes Absolute: 0.5 10*3/uL (ref 0.1–0.9)
Monocytes: 9 %
Neutrophils Absolute: 3.1 10*3/uL (ref 1.4–7.0)
Neutrophils: 59 %
Platelets: 196 10*3/uL (ref 150–450)
RBC: 4.86 x10E6/uL (ref 3.77–5.28)
RDW: 12.3 % (ref 11.7–15.4)
WBC: 5.3 10*3/uL (ref 3.4–10.8)

## 2020-04-14 LAB — LIPID PANEL
Chol/HDL Ratio: 3.7 ratio (ref 0.0–4.4)
Cholesterol, Total: 276 mg/dL — ABNORMAL HIGH (ref 100–199)
HDL: 74 mg/dL (ref >39)
LDL Chol Calc (NIH): 178 mg/dL — ABNORMAL HIGH (ref 0–99)
Triglycerides: 137 mg/dL (ref 0–149)
VLDL Cholesterol Cal: 24 mg/dL (ref 5–40)

## 2020-04-14 LAB — ANA W/REFLEX IF POSITIVE: Anti Nuclear Antibody (ANA): NEGATIVE

## 2020-04-14 LAB — RHEUMATOID FACTOR: Rheumatoid fact SerPl-aCnc: 10.2 IU/mL (ref 0.0–13.9)

## 2020-04-14 LAB — TSH+FREE T4
Free T4: 1.49 ng/dL (ref 0.82–1.77)
TSH: 0.577 u[IU]/mL (ref 0.450–4.500)

## 2020-04-14 LAB — CYCLIC CITRUL PEPTIDE ANTIBODY, IGG/IGA: Cyclic Citrullin Peptide Ab: 4 units (ref 0–19)

## 2020-04-14 NOTE — Progress Notes (Unsigned)
Ref rhu

## 2020-04-15 ENCOUNTER — Other Ambulatory Visit: Payer: Self-pay | Admitting: Internal Medicine

## 2020-04-15 DIAGNOSIS — Z1231 Encounter for screening mammogram for malignant neoplasm of breast: Secondary | ICD-10-CM

## 2020-04-20 ENCOUNTER — Encounter: Payer: Self-pay | Admitting: Internal Medicine

## 2020-04-25 IMAGING — MG MM DIGITAL SCREENING BILAT W/ TOMO W/ CAD
8 series · 8 of 24 positions shown · non-contrast
Comparison: Previous exam(s).

CLINICAL DATA: Screening.

EXAM:
DIGITAL SCREENING BILATERAL MAMMOGRAM WITH TOMO AND CAD

[L MLO synth-2D]
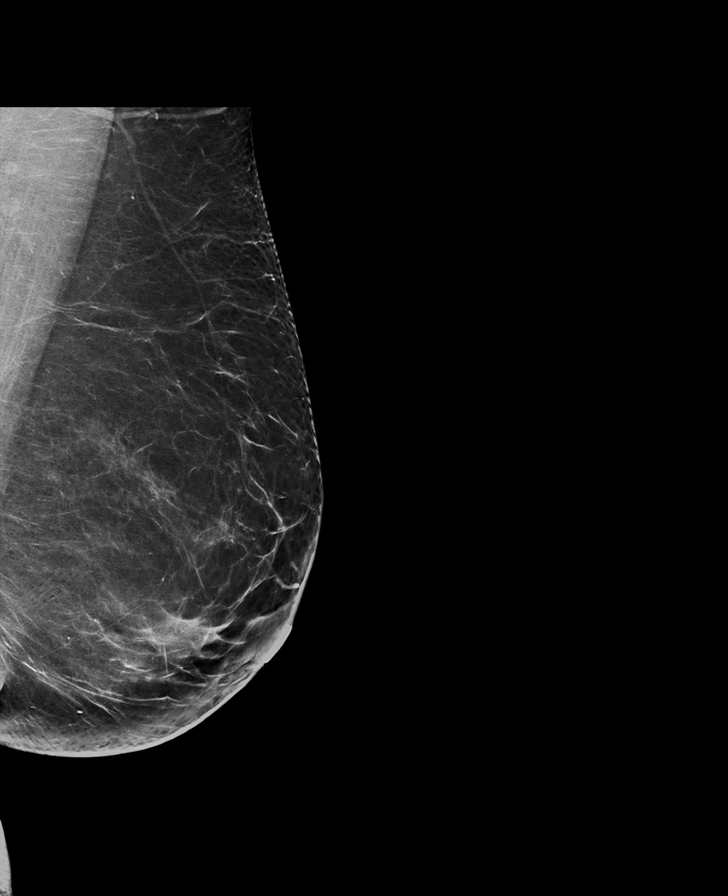

[L CC synth-2D]
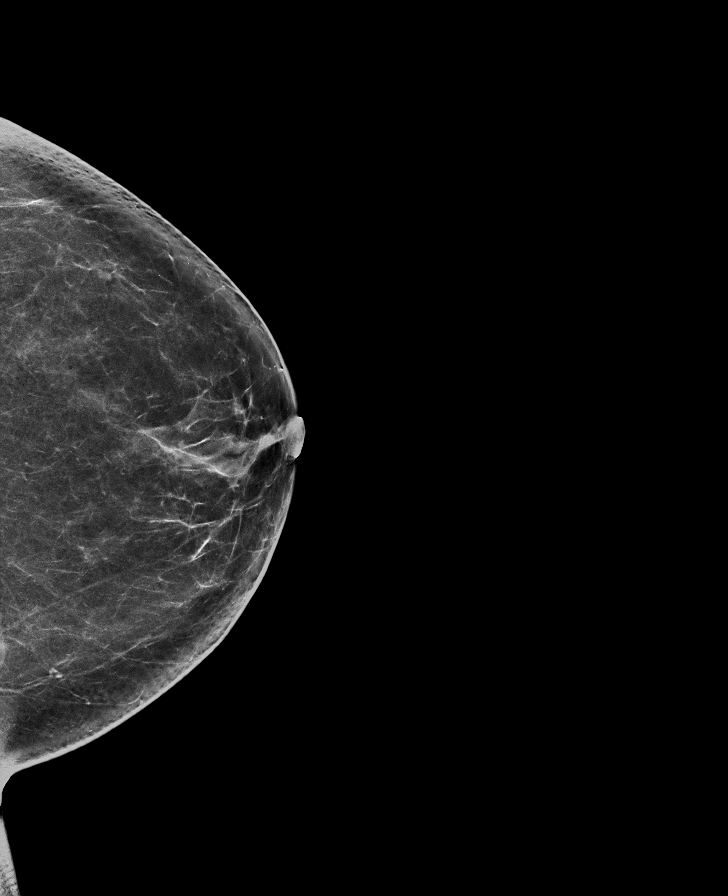

[R CC synth-2D]
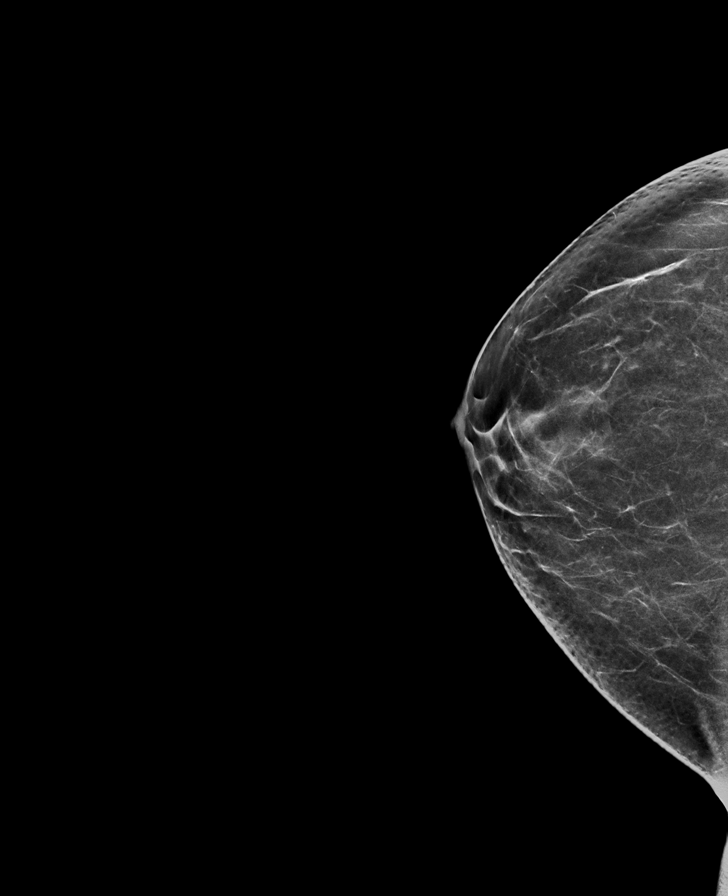

[R MLO synth-2D]
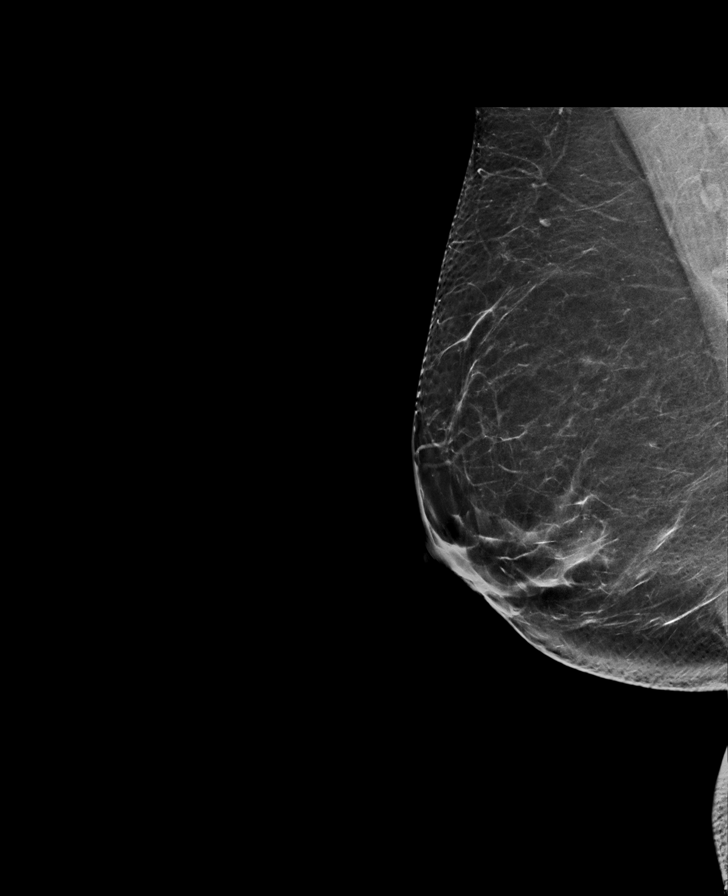

[L CC tomo · tomo slice 37/73.0]
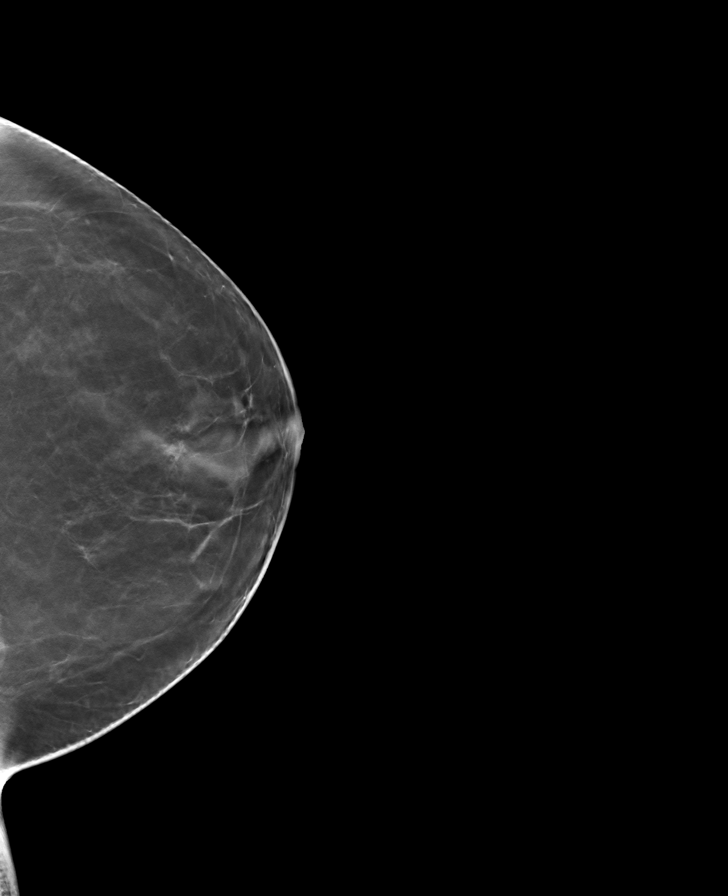

[L MLO tomo · tomo slice 43/85.0]
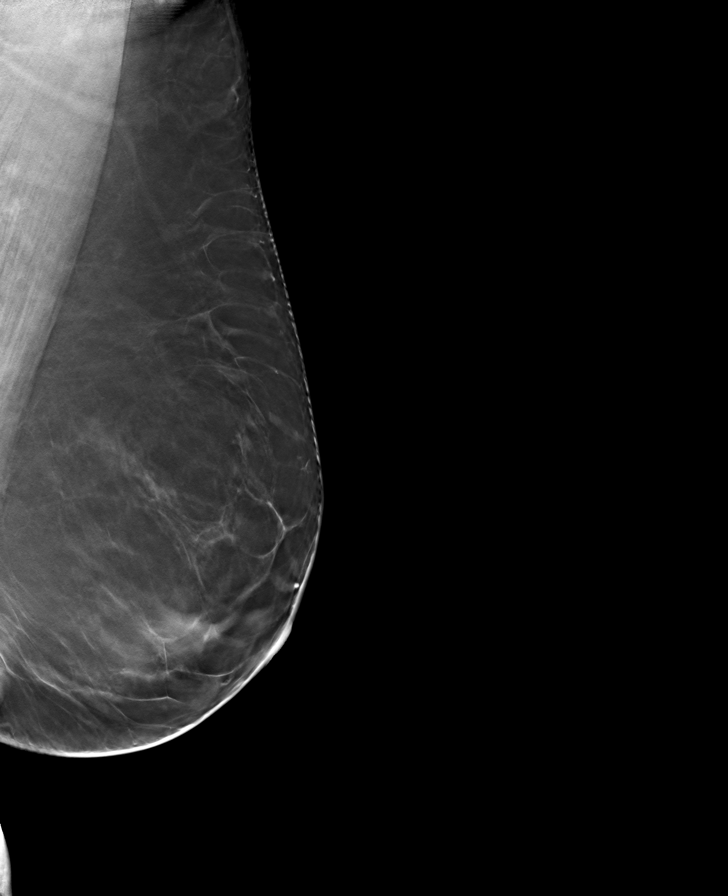

[R CC tomo · tomo slice 34/67.0]
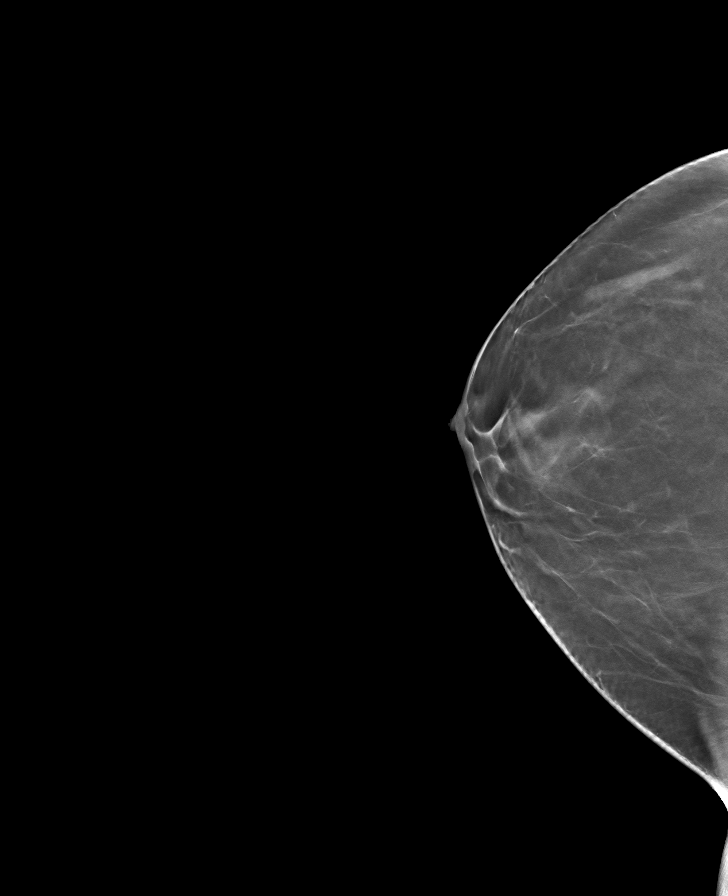

[R MLO tomo · tomo slice 39/77.0]
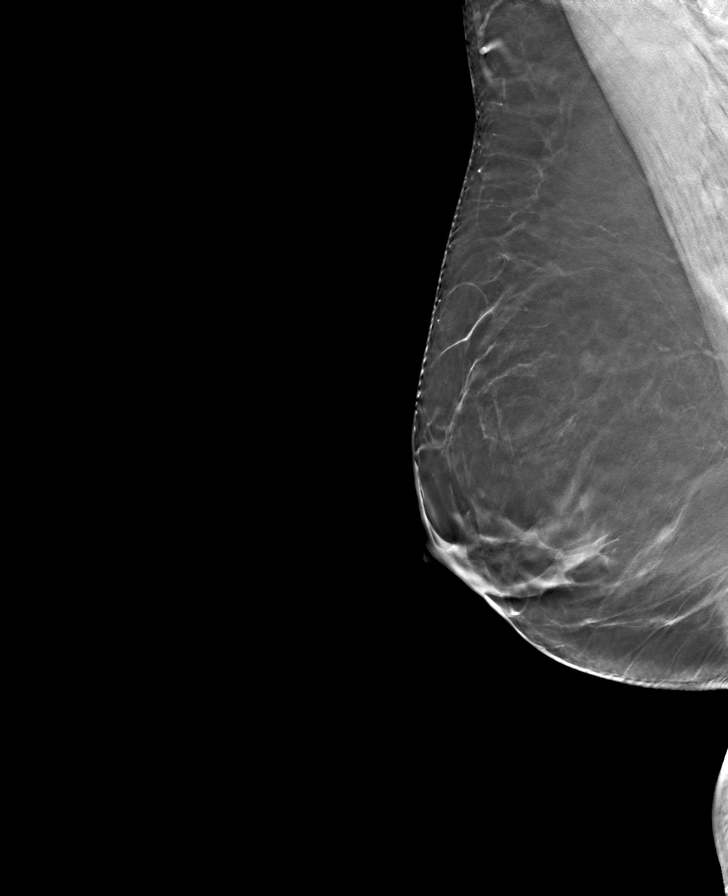

[8 of 24 positions shown; findings below may reference images not displayed]

ACR Breast Density Category b: There are scattered areas of
fibroglandular density.
FINDINGS: There are no findings suspicious for malignancy. Images were
processed with CAD.
IMPRESSION: No mammographic evidence of malignancy. A result letter of this
screening mammogram will be mailed directly to the patient.

RECOMMENDATION:
Screening mammogram in one year. (Code:CN-U-775)

BI-RADS CATEGORY  1: Negative.

## 2020-04-26 ENCOUNTER — Encounter: Payer: BC Managed Care – PPO | Admitting: Internal Medicine

## 2020-05-03 ENCOUNTER — Encounter: Payer: BC Managed Care – PPO | Admitting: Internal Medicine

## 2020-05-07 ENCOUNTER — Encounter: Payer: Self-pay | Admitting: Internal Medicine

## 2020-05-07 ENCOUNTER — Other Ambulatory Visit: Payer: Self-pay

## 2020-05-07 ENCOUNTER — Ambulatory Visit (INDEPENDENT_AMBULATORY_CARE_PROVIDER_SITE_OTHER): Payer: BC Managed Care – PPO | Admitting: Internal Medicine

## 2020-05-07 VITALS — BP 126/80 | HR 72 | Temp 97.9°F | Ht 67.0 in | Wt 179.0 lb

## 2020-05-07 DIAGNOSIS — Z0000 Encounter for general adult medical examination without abnormal findings: Secondary | ICD-10-CM | POA: Diagnosis not present

## 2020-05-07 DIAGNOSIS — Z1231 Encounter for screening mammogram for malignant neoplasm of breast: Secondary | ICD-10-CM | POA: Diagnosis not present

## 2020-05-07 DIAGNOSIS — M25542 Pain in joints of left hand: Secondary | ICD-10-CM

## 2020-05-07 DIAGNOSIS — E034 Atrophy of thyroid (acquired): Secondary | ICD-10-CM

## 2020-05-07 DIAGNOSIS — E782 Mixed hyperlipidemia: Secondary | ICD-10-CM | POA: Diagnosis not present

## 2020-05-07 DIAGNOSIS — G43009 Migraine without aura, not intractable, without status migrainosus: Secondary | ICD-10-CM

## 2020-05-07 DIAGNOSIS — M25541 Pain in joints of right hand: Secondary | ICD-10-CM

## 2020-05-07 DIAGNOSIS — E236 Other disorders of pituitary gland: Secondary | ICD-10-CM

## 2020-05-07 LAB — POCT URINALYSIS DIPSTICK
Bilirubin, UA: NEGATIVE
Blood, UA: NEGATIVE
Glucose, UA: NEGATIVE
Ketones, UA: NEGATIVE
Leukocytes, UA: NEGATIVE
Nitrite, UA: NEGATIVE
Protein, UA: NEGATIVE
Spec Grav, UA: <=1.005 — AB (ref 1.010–1.025)
Urobilinogen, UA: 0.2 E.U./dL
pH, UA: 8 (ref 5.0–8.0)

## 2020-05-07 NOTE — Progress Notes (Signed)
Date:  05/07/2020   Name:  Cindy Fowler   DOB:  1966/05/07   MRN:  [id number]   Chief Complaint: Annual Exam (breast exam no pap)  Cindy Fowler is a 54 y.o. female who presents today for her Complete Annual Exam. She feels fairly well. She reports exercising walking and yoga x7 days a week . She reports she is sleeping fairly well. Breast complaints none. Labs done last visit were reviewed.  Mammogram: 04/2019 scheduled for next week DEXA: none Pap smear: 03/2019 GYN Colonoscopy: 08/2019  Immunization History  Administered Date(s) Administered  . Influenza Inj Mdck Quad Pf 07/07/2018  . Influenza,inj,Quad PF,6+ Mos 06/18/2019  . Moderna SARS-COVID-2 Vaccination 11/22/2019, 12/20/2019  . Pneumococcal Polysaccharide-23 01/24/2012  . Tdap 04/24/2019    Thyroid Problem Presents for follow-up visit. Patient reports no anxiety, constipation, diarrhea, fatigue, palpitations or tremors. The symptoms have been stable.   Joint pain - still having pain in fingers and ankles.  RA and CCP were normal.  ANA negative.  Rheumatology appt scheduled for Sept 1.  Lab Results  Component Value Date   CREATININE 0.78 04/12/2020   BUN 14 04/12/2020   NA 140 04/12/2020   K 4.4 04/12/2020   CL 104 04/12/2020   CO2 21 04/12/2020   Lab Results  Component Value Date   CHOL 276 (H) 04/12/2020   HDL 74 04/12/2020   LDLCALC 178 (H) 04/12/2020   TRIG 137 04/12/2020   CHOLHDL 3.7 04/12/2020   Lab Results  Component Value Date   TSH 0.577 04/12/2020   Lab Results  Component Value Date   HGBA1C 5.3 04/17/2017   Lab Results  Component Value Date   WBC 5.3 04/12/2020   HGB 15.1 04/12/2020   HCT 44.5 04/12/2020   MCV 92 04/12/2020   PLT 196 04/12/2020   Lab Results  Component Value Date   ALT 18 04/12/2020   AST 20 04/12/2020   ALKPHOS 74 04/12/2020   BILITOT 0.5 04/12/2020     Review of Systems  Constitutional: Negative for chills, fatigue and fever.  HENT:  Negative for congestion, hearing loss, tinnitus, trouble swallowing and voice change.   Eyes: Negative for visual disturbance.  Respiratory: Negative for cough, chest tightness, shortness of breath and wheezing.   Cardiovascular: Negative for chest pain, palpitations and leg swelling.  Gastrointestinal: Negative for abdominal pain, constipation, diarrhea and vomiting.  Endocrine: Negative for polydipsia and polyuria.  Genitourinary: Negative for dysuria, frequency, genital sores, vaginal bleeding and vaginal discharge.  Musculoskeletal: Positive for arthralgias (joint pain in hands and ankles). Negative for gait problem and joint swelling.  Skin: Negative for color change and rash.  Neurological: Positive for headaches (controlled by Effexor and Acetazolamide). Negative for dizziness, tremors and light-headedness.  Hematological: Negative for adenopathy. Does not bruise/bleed easily.  Psychiatric/Behavioral: Negative for dysphoric mood and sleep disturbance. The patient is not nervous/anxious.     Patient Active Problem List   Diagnosis Date Noted  . History of colonic polyps 01/20/2019  . Chronic pain of both knees 04/22/2018  . Abnormal vaginal bleeding 05/05/2016  . Mixed hyperlipidemia 04/14/2015  . Hypothyroidism 04/14/2015  . Empty sella ([place]) 04/14/2015  . 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 Arthritis 04/14/2015  . Cataract 05/11/2014    Allergies  Allergen Reactions  . Propoxyphene Nausea And Vomiting  . Fluconazole Swelling    eyes  . Clarithromycin Palpitations and Anxiety    Shakey, GI upset  Past Surgical History:  Procedure Laterality Date  . ADENOIDECTOMY    . CATARACT EXTRACTION Bilateral 2015  . CESAREAN SECTION  1994  . DILATION AND CURETTAGE OF UTERUS    . TONSILLECTOMY    . TUBAL LIGATION      Social History   Tobacco Use  . Smoking status: Never Smoker  . Smokeless tobacco: Never Used  Vaping Use  . Vaping  Use: Never used  Substance Use Topics  . Alcohol use: Yes    Alcohol/week: 0.0 standard drinks  . Drug use: No     Medication list has been reviewed and updated.  Current Meds  Medication Sig  . acetaZOLAMIDE (DIAMOX) 125 MG tablet Take by mouth.  . Calcium Carbonate 1500 (600 CA) MG TABS Take 1 tablet by mouth daily at 2 PM daily at 2 PM.  . levothyroxine (SYNTHROID) 112 MCG tablet TAKE 1 TABLET(112 MCG) BY MOUTH DAILY BEFORE BREAKFAST  . Melatonin 3 MG TABS Take 1 tablet by mouth at bedtime.  . MULTIPLE VITAMIN PO Take by mouth.  . naproxen sodium (ALEVE) 220 MG tablet Take 220 mg by mouth every 12 (twelve) hours. Muscle pain  . venlafaxine XR (EFFEXOR-XR) 75 MG 24 hr capsule Take by mouth.    PHQ 2/9 Scores 04/12/2020 04/24/2019 01/20/2019 01/20/2019  PHQ - 2 Score 0 0 1 0  PHQ- 9 Score 0 - - -    GAD 7 : Generalized Anxiety Score 04/12/2020  Nervous, Anxious, on Edge 0  Control/stop worrying 0  Worry too much - different things 0  Trouble relaxing 0  Restless 0  Easily annoyed or irritable 0  Afraid - awful might happen 0  Total GAD 7 Score 0  Anxiety Difficulty Not difficult at all    BP Readings from Last 3 Encounters:  05/07/20 126/80  04/12/20 132/78  04/24/19 102/66    Physical Exam Vitals and nursing note reviewed.  Constitutional:      General: She is not in acute distress.    Appearance: She is well-developed.  HENT:     Head: Normocephalic and atraumatic.     Right Ear: Tympanic membrane and ear canal normal.     Left Ear: Tympanic membrane and ear canal normal.     Nose:     Right Sinus: No maxillary sinus tenderness.     Left Sinus: No maxillary sinus tenderness.  Eyes:     General: No scleral icterus.       Right eye: No discharge.        Left eye: No discharge.     Conjunctiva/sclera: Conjunctivae normal.  Neck:     Thyroid: No thyromegaly.     Vascular: No carotid bruit.  Cardiovascular:     Rate and Rhythm: Normal rate and regular rhythm.      Pulses: Normal pulses.     Heart sounds: Normal heart sounds.  Pulmonary:     Effort: Pulmonary effort is normal. No respiratory distress.     Breath sounds: No wheezing.  Chest:     Breasts:        Right: No mass, nipple discharge, skin change or tenderness.        Left: No mass, nipple discharge, skin change or tenderness.  Abdominal:     General: Bowel sounds are normal.     Palpations: Abdomen is soft.     Tenderness: There is no abdominal tenderness.  Musculoskeletal:     Cervical back: Normal range of motion. No erythema.  Right lower leg: No edema.     Left lower leg: No edema.     Comments: Mild synovitis and tenderness of MCP joints both hands Tender posterior ankles  Lymphadenopathy:     Cervical: No cervical adenopathy.  Skin:    General: Skin is warm and dry.     Findings: No rash.  Neurological:     Mental Status: She is alert and oriented to person, place, and time.     Cranial Nerves: No cranial nerve deficit.     Sensory: No sensory deficit.     Deep Tendon Reflexes: Reflexes are normal and symmetric.  Psychiatric:        Attention and Perception: Attention normal.        Mood and Affect: Mood normal.     Wt Readings from Last 3 Encounters:  05/07/20 179 lb (81.2 kg)  04/12/20 181 lb (82.1 kg)  04/24/19 180 lb (81.6 kg)    BP 126/80   Pulse 72   Temp 97.9 F (36.6 C) (Oral)   Ht 5\' 7"  (1.702 m)   Wt 179 lb (81.2 kg)   SpO2 97%   BMI 28.04 kg/m   Assessment and Plan: 1. Annual physical exam Normal exam Continue exercise, healthy diet  2. Encounter for screening mammogram for breast cancer scheduled  3. 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 Mixed hyperlipidemia Labs discussed - elevated HDL and low 10 yr risk of 2% No medications needed - continue low fat diet choices and exercise  5. Atypical migraine Being followed by Neurology Headaches controlled with effexor and acetazolamide  6. Empty sella  ([place]) Followed by Neurology  7. Arthralgia of both hands Awaiting rheumatology evaluation Reviewed negative RA, CCP and ANA Suspect sero-negative RA vs other    Partially dictated using Editor, commissioning. Any errors are unintentional.  Halina Maidens, MD [facility]  05/07/2020

## 2020-05-11 ENCOUNTER — Other Ambulatory Visit: Payer: Self-pay

## 2020-05-11 ENCOUNTER — Ambulatory Visit
Admission: RE | Admit: 2020-05-11 | Discharge: 2020-05-11 | Disposition: A | Discharge status: Home / Self Care | Payer: BC Managed Care – PPO | Source: Ambulatory Visit | Attending: Internal Medicine | Admitting: Internal Medicine

## 2020-05-11 DIAGNOSIS — Z1231 Encounter for screening mammogram for malignant neoplasm of breast: Secondary | ICD-10-CM | POA: Insufficient documentation

## 2020-06-25 ENCOUNTER — Other Ambulatory Visit: Payer: Self-pay | Admitting: Internal Medicine

## 2020-06-25 DIAGNOSIS — E034 Atrophy of thyroid (acquired): Secondary | ICD-10-CM

## 2021-03-07 ENCOUNTER — Encounter: Payer: Self-pay | Admitting: Internal Medicine

## 2021-03-07 ENCOUNTER — Ambulatory Visit: Payer: BC Managed Care – PPO | Admitting: Internal Medicine

## 2021-03-07 VITALS — BP 142/80 | HR 87 | Temp 98.3°F | Ht 67.0 in | Wt 178.0 lb

## 2021-03-07 DIAGNOSIS — J040 Acute laryngitis: Secondary | ICD-10-CM

## 2021-03-07 DIAGNOSIS — R059 Cough, unspecified: Secondary | ICD-10-CM

## 2021-03-07 LAB — POCT INFLUENZA A/B
Influenza A, POC: NEGATIVE
Influenza B, POC: NEGATIVE

## 2021-03-07 LAB — POCT RAPID STREP A (OFFICE): Rapid Strep A Screen: NEGATIVE

## 2021-03-07 MED ORDER — AMOXICILLIN-POT CLAVULANATE 875-125 MG PO TABS: 1.0000 | ORAL_TABLET | Freq: Two times a day (BID) | ORAL | 0 refills | Status: AC

## 2021-03-07 NOTE — Progress Notes (Signed)
Date:  03/07/2021   Name:  Cindy Fowler   DOB:  1965/10/02   MRN:  [id number]   Chief Complaint: Cough (X6 days, Sore throat, headache, fever)  Cough This is a new problem. The current episode started in the past 7 days. The problem occurs every few minutes. The cough is Non-productive. Associated symptoms include a fever, headaches and a sore throat. Pertinent negatives include no chest pain, chills or shortness of breath. She has tried OTC cough suppressant for the symptoms. The treatment provided mild relief.  Sore Throat  This is a new problem. The current episode started in the past 7 days. The problem has been unchanged. The maximum temperature recorded prior to her arrival was 100.4 - 100.9 F. The fever has been present for 3 to 4 days. The pain is moderate. Associated symptoms include coughing, headaches and a hoarse voice. Pertinent negatives include no abdominal pain, diarrhea, shortness of breath, trouble swallowing or vomiting. She has had no exposure to strep or mono. She has tried acetaminophen for the symptoms. The treatment provided mild relief.   Lab Results  Component Value Date   CREATININE 0.78 04/12/2020   BUN 14 04/12/2020   NA 140 04/12/2020   K 4.4 04/12/2020   CL 104 04/12/2020   CO2 21 04/12/2020   Lab Results  Component Value Date   CHOL 276 (H) 04/12/2020   HDL 74 04/12/2020   LDLCALC 178 (H) 04/12/2020   TRIG 137 04/12/2020   CHOLHDL 3.7 04/12/2020   Lab Results  Component Value Date   TSH 0.577 04/12/2020   Lab Results  Component Value Date   HGBA1C 5.3 04/17/2017   Lab Results  Component Value Date   WBC 5.3 04/12/2020   HGB 15.1 04/12/2020   HCT 44.5 04/12/2020   MCV 92 04/12/2020   PLT 196 04/12/2020   Lab Results  Component Value Date   ALT 18 04/12/2020   AST 20 04/12/2020   ALKPHOS 74 04/12/2020   BILITOT 0.5 04/12/2020     Review of Systems  Constitutional:  Positive for fever. Negative for appetite change and  chills.  HENT:  Positive for hoarse voice and sore throat. Negative for hearing loss, sinus pressure and trouble swallowing.   Eyes:  Negative for visual disturbance.  Respiratory:  Positive for cough. Negative for chest tightness and shortness of breath.   Cardiovascular:  Negative for chest pain and palpitations.  Gastrointestinal:  Negative for abdominal pain, diarrhea and vomiting.  Neurological:  Positive for headaches. Negative for dizziness and light-headedness.   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   Arthritis 04/14/2015   Cataract 05/11/2014    Allergies  Allergen Reactions   Propoxyphene Nausea And Vomiting   Fluconazole Swelling    eyes   Methotrexate Derivatives Hives   Clarithromycin Palpitations and Anxiety    Shakey, GI upset    Past Surgical History:  Procedure Laterality Date   ADENOIDECTOMY     CATARACT EXTRACTION Bilateral 2015   CESAREAN SECTION  1994   DILATION AND CURETTAGE OF UTERUS     TONSILLECTOMY     TUBAL LIGATION      Social History   Tobacco Use  Smoking status: Never   Smokeless tobacco: Never  Vaping Use   Vaping Use: Never used  Substance Use Topics   Alcohol use: Yes    Alcohol/week: 0.0 standard drinks   Drug use: No     Medication list has been reviewed and updated.  Current Meds  Medication Sig   acetaZOLAMIDE (DIAMOX) 125 MG tablet Take by mouth.   Calcium Carbonate 1500 (600 CA) MG TABS Take 1 tablet by mouth daily at 2 PM daily at 2 PM.   celecoxib (CELEBREX) 100 MG capsule Take 100 mg by mouth 2 (two) times daily.   cycloSPORINE (RESTASIS) 0.05 % ophthalmic emulsion Apply to eye.   levothyroxine (SYNTHROID) 112 MCG tablet  TAKE 1 TABLET(112 MCG) BY MOUTH DAILY BEFORE BREAKFAST   Melatonin 3 MG TABS Take 1 tablet by mouth at bedtime.   MULTIPLE VITAMIN PO Take by mouth.   predniSONE (DELTASONE) 5 MG tablet Take by mouth.   RINVOQ 15 MG TB24 Take 1 tablet by mouth daily.   venlafaxine XR (EFFEXOR-XR) 75 MG 24 hr capsule Take by mouth.   [DISCONTINUED] ENBREL SURECLICK 50 MG/ML injection Inject into the skin.    PHQ 2/9 Scores 04/12/2020 04/24/2019 01/20/2019 01/20/2019  PHQ - 2 Score 0 0 1 0  PHQ- 9 Score 0 - - -    GAD 7 : Generalized Anxiety Score 04/12/2020  Nervous, Anxious, on Edge 0  Control/stop worrying 0  Worry too much - different things 0  Trouble relaxing 0  Restless 0  Easily annoyed or irritable 0  Afraid - awful might happen 0  Total GAD 7 Score 0  Anxiety Difficulty Not difficult at all    BP Readings from Last 3 Encounters:  03/07/21 (!) 142/80  05/07/20 126/80  04/12/20 132/78    Physical Exam Vitals and nursing note reviewed.  Constitutional:      General: She is not in acute distress.    Appearance: Normal appearance. She is well-developed.  HENT:     Head: Normocephalic and atraumatic.     Right Ear: Ear canal normal. Tympanic membrane is retracted. Tympanic membrane is not erythematous.     Left Ear: Ear canal normal. Tympanic membrane is retracted. Tympanic membrane is not erythematous.     Nose:     Right Sinus: No maxillary sinus tenderness or frontal sinus tenderness.     Left Sinus: No maxillary sinus tenderness or frontal sinus tenderness.     Mouth/Throat:     Pharynx: Posterior oropharyngeal erythema present. No pharyngeal swelling or oropharyngeal exudate.  Cardiovascular:     Rate and Rhythm: Normal rate and regular rhythm.     Pulses: Normal pulses.     Heart sounds: No murmur heard. Pulmonary:     Effort: Pulmonary effort is normal. No respiratory distress.     Breath sounds: Normal breath sounds and air entry. No decreased breath sounds or wheezing.   Musculoskeletal:     Cervical back: Normal range of motion.     Right lower leg: No edema.     Left lower leg: No edema.  Lymphadenopathy:     Cervical: No cervical adenopathy.  Skin:    General: Skin is warm and dry.     Findings: No rash.  Neurological:     Mental Status: She is alert and oriented to person, place, and time.  Psychiatric:        Mood and Affect: Mood normal.        Behavior: Behavior normal.  Wt Readings from Last 3 Encounters:  03/07/21 178 lb (80.7 kg)  05/07/20 179 lb (81.2 kg)  04/12/20 181 lb (82.1 kg)    BP (!) 142/80   Pulse 87   Temp 98.3 F (36.8 C) (Oral)   Ht 5\' 7"  (1.702 m)   Wt 178 lb (80.7 kg)   SpO2 99%   BMI 27.88 kg/m   Assessment and Plan: 1. Laryngitis, acute Rapid strep and influenza tests are negative Will treat for bacterial infection - amoxicillin-clavulanate (AUGMENTIN) 875-125 MG tablet; Take 1 tablet by mouth 2 (two) times daily for 10 days.  Dispense: 20 tablet; Refill: 0  2. Cough Covid test neg x3 at home in the past 4 days Continue otc cough syrup as needed, fluids, rest   Partially dictated using Editor, commissioning. Any errors are unintentional.  Halina Maidens, MD [facility]  03/07/2021

## 2021-03-07 NOTE — Addendum Note (Signed)
Addended by: Zada Girt on: 03/07/2021 12:26 PM   Modules accepted: Orders

## 2021-03-23 ENCOUNTER — Other Ambulatory Visit: Payer: Self-pay | Admitting: Internal Medicine

## 2021-03-23 DIAGNOSIS — E034 Atrophy of thyroid (acquired): Secondary | ICD-10-CM

## 2021-03-24 NOTE — Telephone Encounter (Signed)
Requested medication (s) are due for refill today: yes  Requested medication (s) are on the active medication list: yes   Future visit scheduled:  yes   Notes to clinic:  Failed protocol: TSH needs to be rechecked within 3 months after an abnormal result. Refill until TSH is due   Requested Prescriptions  Pending Prescriptions Disp Refills   levothyroxine (SYNTHROID) 112 MCG tablet [Pharmacy Med Name: LEVOTHYROXINE 0.112MG  (112MCG) TABS] 90 tablet 2    Sig: TAKE 1 TABLET(112 MCG) BY MOUTH DAILY BEFORE BREAKFAST      Endocrinology:  Hypothyroid Agents Failed - 03/23/2021  7:52 PM      Failed - TSH needs to be rechecked within 3 months after an abnormal result. Refill until TSH is due.      Passed - TSH in normal range and within 360 days    TSH  Date Value Ref Range Status  04/12/2020 0.577 0.450 - 4.500 uIU/mL Final          Passed - Valid encounter within last 12 months    Recent Outpatient Visits           2 weeks ago Laryngitis, acute   [facility] Glean Hess, MD   10 months ago Annual physical exam   [facility] Glean Hess, MD   11 months ago Arthralgia of both hands   [facility] Glean Hess, MD   1 year ago Annual physical exam   [facility] Glean Hess, MD   2 years ago Hypothyroidism due to acquired atrophy of thyroid   [facility] Glean Hess, MD       Future Appointments             In 1 month Army Melia Jesse Sans, MD [facility], [place]

## 2021-04-06 ENCOUNTER — Other Ambulatory Visit: Payer: Self-pay | Admitting: Internal Medicine

## 2021-04-06 DIAGNOSIS — Z1231 Encounter for screening mammogram for malignant neoplasm of breast: Secondary | ICD-10-CM

## 2021-05-09 ENCOUNTER — Other Ambulatory Visit: Payer: Self-pay

## 2021-05-09 ENCOUNTER — Ambulatory Visit (INDEPENDENT_AMBULATORY_CARE_PROVIDER_SITE_OTHER): Payer: BC Managed Care – PPO | Admitting: Internal Medicine

## 2021-05-09 ENCOUNTER — Encounter: Payer: Self-pay | Admitting: Internal Medicine

## 2021-05-09 VITALS — BP 134/82 | HR 63 | Temp 97.7°F | Ht 67.0 in | Wt 181.0 lb

## 2021-05-09 DIAGNOSIS — Z0000 Encounter for general adult medical examination without abnormal findings: Secondary | ICD-10-CM | POA: Diagnosis not present

## 2021-05-09 DIAGNOSIS — Z23 Encounter for immunization: Secondary | ICD-10-CM | POA: Diagnosis not present

## 2021-05-09 DIAGNOSIS — M05732 Rheumatoid arthritis with rheumatoid factor of left wrist without organ or systems involvement: Secondary | ICD-10-CM

## 2021-05-09 DIAGNOSIS — Z1159 Encounter for screening for other viral diseases: Secondary | ICD-10-CM | POA: Diagnosis not present

## 2021-05-09 DIAGNOSIS — Z1231 Encounter for screening mammogram for malignant neoplasm of breast: Secondary | ICD-10-CM

## 2021-05-09 DIAGNOSIS — E034 Atrophy of thyroid (acquired): Secondary | ICD-10-CM

## 2021-05-09 DIAGNOSIS — G43009 Migraine without aura, not intractable, without status migrainosus: Secondary | ICD-10-CM

## 2021-05-09 DIAGNOSIS — M05731 Rheumatoid arthritis with rheumatoid factor of right wrist without organ or systems involvement: Secondary | ICD-10-CM

## 2021-05-09 NOTE — Patient Instructions (Signed)
Please send Shingrix vaccine dates

## 2021-05-09 NOTE — Progress Notes (Signed)
Date:  05/09/2021   Name:  Cindy Fowler   DOB:  10-10-1965   MRN:  [id number]   Chief Complaint: Annual Exam (Breast exam no pap) Cindy Fowler is a 55 y.o. female who presents today for her Complete Annual Exam. She feels well. She reports exercising yoga, walking and stationary bike ride X2-3 days a week. She reports she is sleeping well. Breast complaints none.  Mammogram: 04/2020 scheduled DEXA: none Pap smear: 03/2018 neg with co-testing Colonoscopy: 08/2019  Immunization History  Administered Date(s) Administered   Influenza Inj Mdck Quad Pf 07/07/2018   Influenza,inj,Quad PF,6+ Mos 06/18/2019   Moderna SARS-COV2 Booster Vaccination 05/28/2020, 11/07/2020   Moderna Sars-Covid-2 Vaccination 11/22/2019, 12/20/2019   Pneumococcal Polysaccharide-23 01/24/2012   Tdap 04/24/2019    Thyroid Problem Presents for follow-up visit. Patient reports no anxiety, constipation, diarrhea, fatigue, palpitations or tremors. The symptoms have been stable.  Migraine  This is a recurrent problem. The pain is located in the Vertex region. Pertinent negatives include no abdominal pain, coughing, dizziness, fever, hearing loss, tinnitus or vomiting. Associated symptoms comments: Ataxia and vertigo. Treatments tried: effexor and diamox.  RA - now on Rinvoq and doing well.  Uses Celebrex as needed.  Lab Results  Component Value Date   CREATININE 0.78 04/12/2020   BUN 14 04/12/2020   NA 140 04/12/2020   K 4.4 04/12/2020   CL 104 04/12/2020   CO2 21 04/12/2020   Lab Results  Component Value Date   CHOL 276 (H) 04/12/2020   HDL 74 04/12/2020   LDLCALC 178 (H) 04/12/2020   TRIG 137 04/12/2020   CHOLHDL 3.7 04/12/2020   Lab Results  Component Value Date   TSH 0.577 04/12/2020   Lab Results  Component Value Date   HGBA1C 5.3 04/17/2017   Lab Results  Component Value Date   WBC 5.3 04/12/2020   HGB 15.1 04/12/2020   HCT 44.5 04/12/2020   MCV 92 04/12/2020   PLT 196  04/12/2020   Lab Results  Component Value Date   ALT 18 04/12/2020   AST 20 04/12/2020   ALKPHOS 74 04/12/2020   BILITOT 0.5 04/12/2020     Review of Systems  Constitutional:  Negative for chills, fatigue and fever.  HENT:  Negative for congestion, hearing loss, tinnitus, trouble swallowing and voice change.   Eyes:  Negative for visual disturbance.  Respiratory:  Negative for cough, chest tightness, shortness of breath and wheezing.   Cardiovascular:  Negative for chest pain, palpitations and leg swelling.  Gastrointestinal:  Negative for abdominal pain, constipation, diarrhea and vomiting.  Endocrine: Negative for polydipsia and polyuria.  Genitourinary:  Negative for dysuria, frequency, vaginal bleeding and vaginal discharge.  Musculoskeletal:  Positive for arthralgias and joint swelling. Negative for gait problem.  Skin:  Negative for color change and rash.  Neurological:  Negative for dizziness, tremors, light-headedness and headaches.  Hematological:  Negative for adenopathy. Does not bruise/bleed easily.  Psychiatric/Behavioral:  Negative for dysphoric mood and sleep disturbance. The patient is not nervous/anxious.    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   Rheumatoid arthritis ([place]) 04/14/2015   Cataract 05/11/2014    Allergies  Allergen Reactions   Propoxyphene  Nausea And Vomiting   Fluconazole Swelling    eyes   Methotrexate Derivatives Hives   Clarithromycin Palpitations and Anxiety    Shakey, GI upset    Past Surgical History:  Procedure Laterality Date   ADENOIDECTOMY     CATARACT EXTRACTION Bilateral 2015   CESAREAN SECTION  1994   DILATION AND CURETTAGE OF  UTERUS     TONSILLECTOMY     TUBAL LIGATION      Social History   Tobacco Use   Smoking status: Never   Smokeless tobacco: Never  Vaping Use   Vaping Use: Never used  Substance Use Topics   Alcohol use: Yes    Alcohol/week: 0.0 standard drinks   Drug use: No     Medication list has been reviewed and updated.  Current Meds  Medication Sig   acetaZOLAMIDE (DIAMOX) 125 MG tablet Take by mouth.   Calcium Carbonate 1500 (600 CA) MG TABS Take 1 tablet by mouth daily at 2 PM daily at 2 PM.   celecoxib (CELEBREX) 100 MG capsule Take 100 mg by mouth as needed.   cycloSPORINE (RESTASIS) 0.05 % ophthalmic emulsion 1 drop 2 (two) times daily.   levothyroxine (SYNTHROID) 112 MCG tablet TAKE 1 TABLET(112 MCG) BY MOUTH DAILY BEFORE BREAKFAST   MULTIPLE VITAMIN PO Take by mouth.   RINVOQ 15 MG TB24 Take 1 tablet by mouth daily.   venlafaxine XR (EFFEXOR-XR) 75 MG 24 hr capsule Take by mouth.   [DISCONTINUED] Melatonin 3 MG TABS Take 1 tablet by mouth at bedtime.    PHQ 2/9 Scores 05/09/2021 03/07/2021 04/12/2020 04/24/2019  PHQ - 2 Score 1 0 0 0  PHQ- 9 Score 1 2 0 -    GAD 7 : Generalized Anxiety Score 05/09/2021 03/07/2021 04/12/2020  Nervous, Anxious, on Edge 0 0 0  Control/stop worrying 0 0 0  Worry too much - different things 0 0 0  Trouble relaxing 0 0 0  Restless 0 0 0  Easily annoyed or irritable 0 0 0  Afraid - awful might happen 0 0 0  Total GAD 7 Score 0 0 0  Anxiety Difficulty - - Not difficult at all    BP Readings from Last 3 Encounters:  05/09/21 134/82  03/07/21 (!) 142/80  05/07/20 126/80    Physical Exam Vitals and nursing note reviewed.  Constitutional:      General: She is not in acute distress.    Appearance: She is well-developed.  HENT:     Head: Normocephalic and atraumatic.     Right Ear: Tympanic membrane and ear canal normal.     Left Ear: Tympanic membrane and ear canal normal.     Nose:     Right Sinus: No maxillary sinus tenderness.     Left  Sinus: No maxillary sinus tenderness.  Eyes:     General: No scleral icterus.       Right eye: No discharge.        Left eye: No discharge.     Conjunctiva/sclera: Conjunctivae normal.  Neck:     Thyroid: No thyromegaly.     Vascular: No carotid bruit.  Cardiovascular:     Rate and Rhythm: Normal rate and regular rhythm.     Pulses: Normal pulses.     Heart sounds: Normal heart sounds.  Pulmonary:     Effort: Pulmonary effort is normal. No respiratory distress.     Breath sounds: No wheezing.  Chest:  Breasts:    Right: No mass,  nipple discharge, skin change or tenderness.     Left: No mass, nipple discharge, skin change or tenderness.  Abdominal:     General: Bowel sounds are normal.     Palpations: Abdomen is soft.     Tenderness: There is no abdominal tenderness.  Musculoskeletal:        General: Swelling (both wrists without active inflamation) present. Normal range of motion.     Cervical back: Normal range of motion. No erythema.     Right lower leg: No edema.     Left lower leg: No edema.  Lymphadenopathy:     Cervical: No cervical adenopathy.  Skin:    General: Skin is warm and dry.     Capillary Refill: Capillary refill takes less than 2 seconds.     Findings: No rash.  Neurological:     General: No focal deficit present.     Mental Status: She is alert and oriented to person, place, and time.     Cranial Nerves: No cranial nerve deficit.     Sensory: No sensory deficit.     Deep Tendon Reflexes: Reflexes are normal and symmetric.  Psychiatric:        Attention and Perception: Attention normal.        Mood and Affect: Mood normal.    Wt Readings from Last 3 Encounters:  05/09/21 181 lb (82.1 kg)  03/07/21 178 lb (80.7 kg)  05/07/20 179 lb (81.2 kg)    BP 134/82   Pulse 63   Temp 97.7 F (36.5 C) (Oral)   Ht '5\' 7"'$  (1.702 m)   Wt 181 lb (82.1 kg)   SpO2 97%   BMI 28.35 kg/m   Assessment and Plan: 1. Annual physical exam Normal exam Continue  exercise and healthy diet Screenings and immunizations are up to date - CBC with Differential/Platelet - Comprehensive metabolic panel - Lipid panel  2. Need for hepatitis C screening test - Hepatitis C antibody  3. Encounter for screening mammogram for breast cancer Mammogram is scheduled  4. Need for vaccination for pneumococcus Consider [id number] - she will ask Rheum for recommendations  5. Hypothyroidism due to acquired atrophy of thyroid Supplemented without any sx to suggest poor control - TSH + free T4  6. 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 Any errors are unintentional.  Halina Maidens, MD [facility]  05/09/2021

## 2021-05-10 LAB — LIPID PANEL
Chol/HDL Ratio: 3.4 ratio (ref 0.0–4.4)
Cholesterol, Total: 293 mg/dL — ABNORMAL HIGH (ref 100–199)
HDL: 87 mg/dL (ref >39)
LDL Chol Calc (NIH): 186 mg/dL — ABNORMAL HIGH (ref 0–99)
Triglycerides: 116 mg/dL (ref 0–149)
VLDL Cholesterol Cal: 20 mg/dL (ref 5–40)

## 2021-05-10 LAB — COMPREHENSIVE METABOLIC PANEL
ALT: 17 IU/L (ref 0–32)
AST: 21 IU/L (ref 0–40)
Albumin/Globulin Ratio: 2.4 — ABNORMAL HIGH (ref 1.2–2.2)
Albumin: 4.6 g/dL (ref 3.8–4.9)
Alkaline Phosphatase: 61 IU/L (ref 44–121)
BUN/Creatinine Ratio: 16 (ref 9–23)
BUN: 13 mg/dL (ref 6–24)
Bilirubin Total: 0.4 mg/dL (ref 0.0–1.2)
CO2: 21 mmol/L (ref 20–29)
Calcium: 9.4 mg/dL (ref 8.7–10.2)
Chloride: 105 mmol/L (ref 96–106)
Creatinine, Ser: 0.8 mg/dL (ref 0.57–1.00)
Globulin, Total: 1.9 g/dL (ref 1.5–4.5)
Glucose: 99 mg/dL (ref 65–99)
Potassium: 4 mmol/L (ref 3.5–5.2)
Sodium: 141 mmol/L (ref 134–144)
Total Protein: 6.5 g/dL (ref 6.0–8.5)
eGFR: 88 mL/min/{1.73_m2} (ref >59)

## 2021-05-10 LAB — CBC WITH DIFFERENTIAL/PLATELET
Basophils Absolute: 0 10*3/uL (ref 0.0–0.2)
Basos: 1 %
EOS (ABSOLUTE): 0.1 10*3/uL (ref 0.0–0.4)
Eos: 2 %
Hematocrit: 38.7 % (ref 34.0–46.6)
Hemoglobin: 13.4 g/dL (ref 11.1–15.9)
Immature Grans (Abs): 0 10*3/uL (ref 0.0–0.1)
Immature Granulocytes: 0 %
Lymphocytes Absolute: 2.8 10*3/uL (ref 0.7–3.1)
Lymphs: 57 %
MCH: 31.8 pg (ref 26.6–33.0)
MCHC: 34.6 g/dL (ref 31.5–35.7)
MCV: 92 fL (ref 79–97)
Monocytes Absolute: 0.4 10*3/uL (ref 0.1–0.9)
Monocytes: 7 %
Neutrophils Absolute: 1.6 10*3/uL (ref 1.4–7.0)
Neutrophils: 33 %
Platelets: 188 10*3/uL (ref 150–450)
RBC: 4.21 x10E6/uL (ref 3.77–5.28)
RDW: 12.5 % (ref 11.7–15.4)
WBC: 4.9 10*3/uL (ref 3.4–10.8)

## 2021-05-10 LAB — TSH+FREE T4
Free T4: 1.48 ng/dL (ref 0.82–1.77)
TSH: 0.312 u[IU]/mL — ABNORMAL LOW (ref 0.450–4.500)

## 2021-05-10 LAB — HEPATITIS C ANTIBODY: Hep C Virus Ab: <0.1 s/co ratio (ref 0.0–0.9)

## 2021-05-17 ENCOUNTER — Ambulatory Visit: Payer: BC Managed Care – PPO

## 2021-05-23 ENCOUNTER — Ambulatory Visit
Admission: RE | Admit: 2021-05-23 | Discharge: 2021-05-23 | Disposition: A | Discharge status: Home / Self Care | Payer: BC Managed Care – PPO | Source: Ambulatory Visit | Attending: Internal Medicine | Admitting: Internal Medicine

## 2021-05-23 ENCOUNTER — Other Ambulatory Visit: Payer: Self-pay

## 2021-05-23 DIAGNOSIS — Z1231 Encounter for screening mammogram for malignant neoplasm of breast: Secondary | ICD-10-CM | POA: Insufficient documentation

## 2021-06-23 ENCOUNTER — Other Ambulatory Visit: Payer: Self-pay | Admitting: Internal Medicine

## 2021-06-23 DIAGNOSIS — E034 Atrophy of thyroid (acquired): Secondary | ICD-10-CM

## 2021-06-23 NOTE — Telephone Encounter (Signed)
Requested medications are due for refill today.  yes  Requested medications are on the active medications list.  yes  Last refill. 03/24/2021  Future visit scheduled.   yes  Notes to clinic.  Failed protocol d/t abnormal labs.

## 2021-07-07 ENCOUNTER — Encounter: Payer: Self-pay | Admitting: Internal Medicine

## 2021-09-21 ENCOUNTER — Other Ambulatory Visit: Payer: Self-pay | Admitting: Internal Medicine

## 2021-09-21 DIAGNOSIS — E034 Atrophy of thyroid (acquired): Secondary | ICD-10-CM

## 2021-09-21 NOTE — Telephone Encounter (Signed)
Requested Prescriptions  Pending Prescriptions Disp Refills   levothyroxine (SYNTHROID) 112 MCG tablet [Pharmacy Med Name: LEVOTHYROXINE 0.112MG  (112MCG) TABS] 90 tablet 0    Sig: TAKE 1 TABLET(112 MCG) BY MOUTH DAILY BEFORE BREAKFAST     Endocrinology:  Hypothyroid Agents Failed - 09/21/2021  6:19 AM      Failed - TSH needs to be rechecked within 3 months after an abnormal result. Refill until TSH is due.      Failed - TSH in normal range and within 360 days    TSH  Date Value Ref Range Status  05/09/2021 0.312 (L) 0.450 - 4.500 uIU/mL Final         Passed - Valid encounter within last 12 months    Recent Outpatient Visits          4 months ago Annual physical exam   [facility] Glean Hess, MD   6 months ago Laryngitis, acute   [facility] Glean Hess, MD   1 year ago Annual physical exam   [facility] Glean Hess, [facility]   1 year ago Arthralgia of both hands   [facility] Glean Hess, MD   2 years ago Annual physical exam   [facility] Glean Hess, MD      Future Appointments            In 7 months Army Melia Jesse Sans, MD [facility], [facility]

## 2021-09-27 ENCOUNTER — Encounter: Payer: Self-pay | Admitting: Internal Medicine

## 2021-09-27 DIAGNOSIS — H43813 Vitreous degeneration, bilateral: Secondary | ICD-10-CM | POA: Insufficient documentation

## 2021-10-24 ENCOUNTER — Encounter: Payer: Self-pay | Admitting: Internal Medicine

## 2021-10-25 ENCOUNTER — Telehealth: Payer: BC Managed Care – PPO | Admitting: Internal Medicine

## 2021-12-20 ENCOUNTER — Other Ambulatory Visit: Payer: Self-pay | Admitting: Internal Medicine

## 2021-12-20 DIAGNOSIS — E034 Atrophy of thyroid (acquired): Secondary | ICD-10-CM

## 2021-12-21 NOTE — Telephone Encounter (Signed)
Requested Prescriptions  ?Pending Prescriptions Disp Refills  ?? levothyroxine (SYNTHROID) 112 MCG tablet [Pharmacy Med Name: LEVOTHYROXINE 0.'112MG'$  (112MCG) TABS] 90 tablet 1  ?  Sig: TAKE 1 TABLET(112 MCG) BY MOUTH DAILY BEFORE BREAKFAST.  ?  ? Endocrinology:  Hypothyroid Agents Failed - 12/20/2021  6:19 AM  ?  ?  Failed - TSH in normal range and within 360 days  ?  TSH  ?Date Value Ref Range Status  ?05/09/2021 0.312 (L) 0.450 - 4.500 uIU/mL Final  ?   ?  ?  Passed - Valid encounter within last 12 months  ?  Recent Outpatient Visits   ?      ? 7 months ago Annual physical exam  ? [facility] Glean Hess, MD  ? 9 months ago Laryngitis, acute  ? [facility] Glean Hess, MD  ? 1 year ago Annual physical exam  ? [facility] Glean Hess, MD  ? 1 year ago Arthralgia of both hands  ? [facility] Glean Hess, MD  ? 2 years ago Annual physical exam  ? [facility] Glean Hess, MD  ?  ?  ?Future Appointments   ?        ? In 4 months Army Melia Jesse Sans, MD [facility], [place]  ?  ? ?  ?  ?  ? ? ?

## 2022-04-27 ENCOUNTER — Other Ambulatory Visit: Payer: Self-pay | Admitting: Internal Medicine

## 2022-04-27 DIAGNOSIS — Z1231 Encounter for screening mammogram for malignant neoplasm of breast: Secondary | ICD-10-CM

## 2022-05-11 ENCOUNTER — Encounter: Payer: Self-pay | Admitting: Internal Medicine

## 2022-05-11 ENCOUNTER — Ambulatory Visit (INDEPENDENT_AMBULATORY_CARE_PROVIDER_SITE_OTHER): Payer: BC Managed Care – PPO | Admitting: Internal Medicine

## 2022-05-11 VITALS — BP 132/72 | HR 63 | Ht 67.0 in | Wt 171.0 lb

## 2022-05-11 DIAGNOSIS — E782 Mixed hyperlipidemia: Secondary | ICD-10-CM | POA: Diagnosis not present

## 2022-05-11 DIAGNOSIS — Z1231 Encounter for screening mammogram for malignant neoplasm of breast: Secondary | ICD-10-CM

## 2022-05-11 DIAGNOSIS — Z0000 Encounter for general adult medical examination without abnormal findings: Secondary | ICD-10-CM

## 2022-05-11 DIAGNOSIS — M05731 Rheumatoid arthritis with rheumatoid factor of right wrist without organ or systems involvement: Secondary | ICD-10-CM

## 2022-05-11 DIAGNOSIS — M05732 Rheumatoid arthritis with rheumatoid factor of left wrist without organ or systems involvement: Secondary | ICD-10-CM

## 2022-05-11 DIAGNOSIS — Z131 Encounter for screening for diabetes mellitus: Secondary | ICD-10-CM

## 2022-05-11 DIAGNOSIS — E034 Atrophy of thyroid (acquired): Secondary | ICD-10-CM

## 2022-05-11 DIAGNOSIS — E236 Other disorders of pituitary gland: Secondary | ICD-10-CM

## 2022-05-11 NOTE — Progress Notes (Signed)
Date:  05/11/2022   Name:  Cindy Fowler   DOB:  Jul 10, 1966   MRN:  [id number]   Chief Complaint: Annual Exam Cindy Fowler is a 56 y.o. female who presents today for her Complete Annual Exam. She feels well. She reports exercising. She reports she is sleeping well. Breast complaints - none.  Mammogram: 04/2021 scheduled DEXA: none Pap smear: 03/2018 neg/neg (GYN) Colonoscopy: 08/2019 repeat 5 yrs  Health Maintenance Due  Topic Date Due   COVID-19 Vaccine (3 - Moderna risk series) 12/05/2020   INFLUENZA VACCINE  04/25/2022    Immunization History  Administered Date(s) Administered   Influenza Inj Mdck Quad Pf 07/07/2018   Influenza,inj,Quad PF,6+ Mos 06/18/2019   Moderna SARS-COV2 Booster Vaccination 05/28/2020, 11/07/2020   Moderna Sars-Covid-2 Vaccination 11/22/2019, 12/20/2019   Pneumococcal Polysaccharide-23 01/24/2012   Tdap 04/24/2019   Zoster Recombinat (Shingrix) 07/11/2020, 10/01/2020    HPI  Lab Results  Component Value Date   NA 141 05/09/2021   K 4.0 05/09/2021   CO2 21 05/09/2021   GLUCOSE 99 05/09/2021   BUN 13 05/09/2021   CREATININE 0.80 05/09/2021   CALCIUM 9.4 05/09/2021   EGFR 88 05/09/2021   GFRNONAA 87 04/12/2020   Lab Results  Component Value Date   CHOL 293 (H) 05/09/2021   HDL 87 05/09/2021   LDLCALC 186 (H) 05/09/2021   TRIG 116 05/09/2021   CHOLHDL 3.4 05/09/2021   Lab Results  Component Value Date   TSH 0.312 (L) 05/09/2021   Lab Results  Component Value Date   HGBA1C 5.3 04/17/2017   Lab Results  Component Value Date   WBC 4.9 05/09/2021   HGB 13.4 05/09/2021   HCT 38.7 05/09/2021   MCV 92 05/09/2021   PLT 188 05/09/2021   Lab Results  Component Value Date   ALT 17 05/09/2021   AST 21 05/09/2021   ALKPHOS 61 05/09/2021   BILITOT 0.4 05/09/2021   No results found for: "25OHVITD2", "25OHVITD3", "VD25OH"   Review of Systems  Constitutional:  Negative for chills, fatigue and fever.  HENT:  Negative  for congestion, hearing loss, tinnitus, trouble swallowing and voice change.   Eyes:  Negative for visual disturbance.  Respiratory:  Negative for cough, chest tightness, shortness of breath and wheezing.   Cardiovascular:  Negative for chest pain, palpitations and leg swelling.  Gastrointestinal:  Negative for abdominal pain, constipation, diarrhea and vomiting.  Endocrine: Negative for polydipsia and polyuria.  Genitourinary:  Negative for dysuria, frequency, genital sores, menstrual problem, vaginal bleeding and vaginal discharge.  Musculoskeletal:  Positive for arthralgias. Negative for gait problem and joint swelling.  Skin:  Negative for color change and rash.  Neurological:  Negative for dizziness, tremors, light-headedness and headaches.  Hematological:  Negative for adenopathy. Does not bruise/bleed easily.  Psychiatric/Behavioral:  Negative for dysphoric mood and sleep disturbance. The patient is not nervous/anxious.     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   Rheumatoid arthritis ([place]) 04/14/2015   Cataract 05/11/2014    Allergies  Allergen Reactions   Propoxyphene Nausea And Vomiting   Fluconazole Swelling    eyes   Methotrexate Derivatives Hives  Clarithromycin Palpitations and Anxiety    Shakey, GI upset    Past Surgical History:  Procedure Laterality Date   ADENOIDECTOMY     CATARACT EXTRACTION Bilateral 2015   CESAREAN SECTION  1994   DILATION AND CURETTAGE OF UTERUS     TONSILLECTOMY     TUBAL LIGATION      Social History   Tobacco Use   Smoking status: Never   Smokeless tobacco: Never  Vaping Use    Vaping Use: Never used  Substance Use Topics   Alcohol use: Yes    Alcohol/week: 0.0 standard drinks of alcohol   Drug use: No     Medication list has been reviewed and updated.  Current Meds  Medication Sig   acetaZOLAMIDE (DIAMOX) 125 MG tablet Take by mouth.   Calcium Carbonate 1500 (600 CA) MG TABS Take 1 tablet by mouth daily at 2 PM daily at 2 PM.   celecoxib (CELEBREX) 100 MG capsule Take 100 mg by mouth as needed.   cycloSPORINE (RESTASIS) 0.05 % ophthalmic emulsion 1 drop 2 (two) times daily.   hydroxychloroquine (PLAQUENIL) 200 MG tablet Take 200 mg by mouth 2 (two) times daily.   levothyroxine (SYNTHROID) 112 MCG tablet TAKE 1 TABLET(112 MCG) BY MOUTH DAILY BEFORE BREAKFAST.   MULTIPLE VITAMIN PO Take by mouth.   RINVOQ 15 MG TB24 Take 1 tablet by mouth daily.   venlafaxine XR (EFFEXOR-XR) 37.5 MG 24 hr capsule Take 37.5 mg by mouth daily.       05/11/2022    9:55 AM 05/09/2021    8:09 AM 03/07/2021   11:17 AM 04/12/2020   10:28 AM  GAD 7 : Generalized Anxiety Score  Nervous, Anxious, on Edge 0 0 0 0  Control/stop worrying 0 0 0 0  Worry too much - different things 0 0 0 0  Trouble relaxing 0 0 0 0  Restless 0 0 0 0  Easily annoyed or irritable 0 0 0 0  Afraid - awful might happen 0 0 0 0  Total GAD 7 Score 0 0 0 0  Anxiety Difficulty Not difficult at all   Not difficult at all       05/11/2022    9:55 AM 05/09/2021    8:08 AM 03/07/2021   11:16 AM  Depression screen PHQ 2/9  Decreased Interest 0 0 0  Down, Depressed, Hopeless 0 1 0  PHQ - 2 Score 0 1 0  Altered sleeping 0 0 0  Tired, decreased energy 0 0 2  Change in appetite 0 0 0  Feeling bad or failure about yourself  0 0 0  Trouble concentrating 0 0 0  Moving slowly or fidgety/restless 0 0 0  Suicidal thoughts 0 0 0  PHQ-9 Score 0 1 2  Difficult doing work/chores Not difficult at all Not difficult at all Not difficult at all    BP Readings from Last 3 Encounters:  05/11/22 132/72  05/09/21  134/82  03/07/21 (!) 142/80    Physical Exam Vitals and nursing note reviewed.  Constitutional:      General: She is not in acute distress.    Appearance: She is well-developed.  HENT:     Head: Normocephalic and atraumatic.     Right Ear: Tympanic membrane and ear canal normal.     Left Ear: Tympanic membrane and ear canal normal.     Nose:     Right Sinus: No maxillary sinus tenderness.     Left Sinus: No maxillary  sinus tenderness.  Eyes:     General: No scleral icterus.       Right eye: No discharge.        Left eye: No discharge.     Conjunctiva/sclera: Conjunctivae normal.  Neck:     Thyroid: No thyromegaly.     Vascular: No carotid bruit.  Cardiovascular:     Rate and Rhythm: Normal rate and regular rhythm.     Pulses: Normal pulses.     Heart sounds: Normal heart sounds.  Pulmonary:     Effort: Pulmonary effort is normal. No respiratory distress.     Breath sounds: No wheezing.  Chest:  Breasts:    Right: No mass, nipple discharge, skin change or tenderness.     Left: No mass, nipple discharge, skin change or tenderness.  Abdominal:     General: Bowel sounds are normal.     Palpations: Abdomen is soft.     Tenderness: There is no abdominal tenderness.  Musculoskeletal:        General: No swelling.     Cervical back: Normal range of motion. No erythema.     Right lower leg: No edema.     Left lower leg: No edema.  Lymphadenopathy:     Cervical: No cervical adenopathy.  Skin:    General: Skin is warm and dry.     Capillary Refill: Capillary refill takes less than 2 seconds.     Findings: No rash.  Neurological:     General: No focal deficit present.     Mental Status: She is alert and oriented to person, place, and time.     Cranial Nerves: No cranial nerve deficit.     Sensory: No sensory deficit.     Deep Tendon Reflexes: Reflexes are normal and symmetric.  Psychiatric:        Attention and Perception: Attention normal.        Mood and Affect: Mood  normal.     Wt Readings from Last 3 Encounters:  05/11/22 171 lb (77.6 kg)  05/09/21 181 lb (82.1 kg)  03/07/21 178 lb (80.7 kg)    BP 132/72   Pulse 63   Ht '5\' 7"'  (1.702 m)   Wt 171 lb (77.6 kg)   SpO2 95%   BMI 26.78 kg/m   Assessment and Plan: 1. Annual physical exam Normal exam Some weight loss with diet changes Up to date on screenings and immunizations.  2. Encounter for screening mammogram for breast cancer scheduled  3. Screening for diabetes mellitus - Hemoglobin A1c  4. Mixed hyperlipidemia Continue healthy diet - Comprehensive metabolic panel - Lipid panel  5. Hypothyroidism due to acquired atrophy of thyroid supplemented - TSH + free T4  6. 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  7. Empty sella ([place]) Followed by Neurology for chronic headaches.   Partially dictated using Editor, commissioning. Any errors are unintentional.  Halina Maidens, MD [facility]  05/11/2022

## 2022-05-12 LAB — COMPREHENSIVE METABOLIC PANEL
ALT: 17 IU/L (ref 0–32)
AST: 28 IU/L (ref 0–40)
Albumin/Globulin Ratio: 2.4 — ABNORMAL HIGH (ref 1.2–2.2)
Albumin: 4.6 g/dL (ref 3.8–4.9)
Alkaline Phosphatase: 51 IU/L (ref 44–121)
BUN/Creatinine Ratio: 14 (ref 9–23)
BUN: 10 mg/dL (ref 6–24)
Bilirubin Total: 0.5 mg/dL (ref 0.0–1.2)
CO2: 21 mmol/L (ref 20–29)
Calcium: 9.5 mg/dL (ref 8.7–10.2)
Chloride: 106 mmol/L (ref 96–106)
Creatinine, Ser: 0.71 mg/dL (ref 0.57–1.00)
Globulin, Total: 1.9 g/dL (ref 1.5–4.5)
Glucose: 99 mg/dL (ref 70–99)
Potassium: 4.3 mmol/L (ref 3.5–5.2)
Sodium: 141 mmol/L (ref 134–144)
Total Protein: 6.5 g/dL (ref 6.0–8.5)
eGFR: 100 mL/min/{1.73_m2} (ref >59)

## 2022-05-12 LAB — LIPID PANEL
Chol/HDL Ratio: 3 ratio (ref 0.0–4.4)
Cholesterol, Total: 294 mg/dL — ABNORMAL HIGH (ref 100–199)
HDL: 98 mg/dL (ref >39)
LDL Chol Calc (NIH): 173 mg/dL — ABNORMAL HIGH (ref 0–99)
Triglycerides: 134 mg/dL (ref 0–149)
VLDL Cholesterol Cal: 23 mg/dL (ref 5–40)

## 2022-05-12 LAB — TSH+FREE T4
Free T4: 1.43 ng/dL (ref 0.82–1.77)
TSH: 0.361 u[IU]/mL — ABNORMAL LOW (ref 0.450–4.500)

## 2022-05-12 LAB — HEMOGLOBIN A1C
Est. average glucose Bld gHb Est-mCnc: 114 mg/dL
Hgb A1c MFr Bld: 5.6 % (ref 4.8–5.6)

## 2022-05-25 ENCOUNTER — Ambulatory Visit
Admission: RE | Admit: 2022-05-25 | Discharge: 2022-05-25 | Disposition: A | Discharge status: Home / Self Care | Payer: BC Managed Care – PPO | Source: Ambulatory Visit | Attending: Internal Medicine | Admitting: Internal Medicine

## 2022-05-25 DIAGNOSIS — Z1231 Encounter for screening mammogram for malignant neoplasm of breast: Secondary | ICD-10-CM | POA: Insufficient documentation

## 2022-06-16 ENCOUNTER — Other Ambulatory Visit: Payer: Self-pay | Admitting: Internal Medicine

## 2022-06-16 DIAGNOSIS — E034 Atrophy of thyroid (acquired): Secondary | ICD-10-CM

## 2022-06-16 NOTE — Telephone Encounter (Signed)
Requested Prescriptions  Pending Prescriptions Disp Refills  . levothyroxine (SYNTHROID) 112 MCG tablet [Pharmacy Med Name: LEVOTHYROXINE 0.'112MG'$  (112MCG) TABS] 90 tablet 0    Sig: TAKE 1 TABLET(112 MCG) BY MOUTH DAILY BEFORE BREAKFAST     Endocrinology:  Hypothyroid Agents Failed - 06/16/2022  6:20 AM      Failed - TSH in normal range and within 360 days    TSH  Date Value Ref Range Status  05/11/2022 0.361 (L) 0.450 - 4.500 uIU/mL Final         Passed - Valid encounter within last 12 months    Recent Outpatient Visits          1 month ago Annual physical exam   [place] [facility] at [facility], Jesse Sans, MD   1 year ago Annual physical exam   [place] [facility] at [facility], Jesse Sans, MD   1 year ago Laryngitis, acute   [place] [facility] at [facility], Jesse Sans, MD   2 years ago Annual physical exam   [facility] at [facility], Jesse Sans, MD   2 years ago Arthralgia of both hands   [place] [facility] at [facility], Jesse Sans, MD      Future Appointments            In 11 months Army Melia Jesse Sans, MD [place] [facility] at [facility], [facility]

## 2022-09-14 ENCOUNTER — Other Ambulatory Visit: Payer: Self-pay | Admitting: Internal Medicine

## 2022-09-14 DIAGNOSIS — E034 Atrophy of thyroid (acquired): Secondary | ICD-10-CM

## 2022-12-01 ENCOUNTER — Ambulatory Visit
Admission: EM | Admit: 2022-12-01 | Discharge: 2022-12-01 | Disposition: A | Discharge status: Home / Self Care | Payer: BC Managed Care – PPO | Attending: Family Medicine | Admitting: Family Medicine

## 2022-12-01 ENCOUNTER — Encounter: Payer: Self-pay | Admitting: Internal Medicine

## 2022-12-01 DIAGNOSIS — R197 Diarrhea, unspecified: Secondary | ICD-10-CM | POA: Insufficient documentation

## 2022-12-01 LAB — COMPREHENSIVE METABOLIC PANEL
ALT: 20 U/L (ref 0–44)
AST: 23 U/L (ref 15–41)
Albumin: 4.2 g/dL (ref 3.5–5.0)
Alkaline Phosphatase: 37 U/L — ABNORMAL LOW (ref 38–126)
Anion gap: 7 (ref 5–15)
BUN: 14 mg/dL (ref 6–20)
CO2: 20 mmol/L — ABNORMAL LOW (ref 22–32)
Calcium: 8.8 mg/dL — ABNORMAL LOW (ref 8.9–10.3)
Chloride: 108 mmol/L (ref 98–111)
Creatinine, Ser: 0.65 mg/dL (ref 0.44–1.00)
GFR, Estimated: >60 mL/min (ref >60)
Glucose, Bld: 93 mg/dL (ref 70–99)
Potassium: 3.6 mmol/L (ref 3.5–5.1)
Sodium: 135 mmol/L (ref 135–145)
Total Bilirubin: 0.6 mg/dL (ref 0.3–1.2)
Total Protein: 7.1 g/dL (ref 6.5–8.1)

## 2022-12-01 LAB — CBC WITH DIFFERENTIAL/PLATELET
Abs Immature Granulocytes: 0.02 10*3/uL (ref 0.00–0.07)
Basophils Absolute: 0 10*3/uL (ref 0.0–0.1)
Basophils Relative: 0 %
Eosinophils Absolute: 0.1 10*3/uL (ref 0.0–0.5)
Eosinophils Relative: 1 %
HCT: 36.8 % (ref 36.0–46.0)
Hemoglobin: 12.4 g/dL (ref 12.0–15.0)
Immature Granulocytes: 0 %
Lymphocytes Relative: 30 %
Lymphs Abs: 1.4 10*3/uL (ref 0.7–4.0)
MCH: 32.3 pg (ref 26.0–34.0)
MCHC: 33.7 g/dL (ref 30.0–36.0)
MCV: 95.8 fL (ref 80.0–100.0)
Monocytes Absolute: 0.5 10*3/uL (ref 0.1–1.0)
Monocytes Relative: 11 %
Neutro Abs: 2.7 10*3/uL (ref 1.7–7.7)
Neutrophils Relative %: 58 %
Platelets: 187 10*3/uL (ref 150–400)
RBC: 3.84 MIL/uL — ABNORMAL LOW (ref 3.87–5.11)
RDW: 12.6 % (ref 11.5–15.5)
WBC: 4.7 10*3/uL (ref 4.0–10.5)
nRBC: 0 % (ref 0.0–0.2)

## 2022-12-01 MED ORDER — CIPROFLOXACIN HCL 500 MG PO TABS: 500.0000 mg | ORAL_TABLET | Freq: Two times a day (BID) | ORAL | 0 refills | Status: DC

## 2022-12-01 NOTE — ED Provider Notes (Signed)
[facility]    CSN: [id number] Arrival date & time: 12/01/22  1622      History   Chief Complaint Chief Complaint  Patient presents with   Diarrhea    HPI Cindy Fowler is a 57 y.o. female.   HPI  Cindy Fowler presents for persistent diarrhea for the past week. She was on vacation last week in [place].  The day after returning she starting having bad diarrhea. Took some Imodium that helps but the diarrhea keeps coming back.  She had bad abdominal cramping. Started eating bland foods. She feels full soon after eating.   Had some congestion. No burping and not passing significant amounts of gas. Her husband had congestion but no diarrhea.   Symptoms Nausea/Vomiting: no  Diarrhea: yes  Constipation: no  Melena/BRBPR: no  Hematemesis: no  Anorexia: yes  Fever/Chills: yes chills  Dysuria: no  Rash: no  Sore throat: yes   Cough: yes Nasal congestion: yes Sleep disturbance: yes Back Pain: no Headache: yes   History reviewed. No pertinent past medical history.  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      Home Medications    Prior to Admission medications   Medication Sig Start Date End Date Taking? Authorizing Provider  acetaZOLAMIDE (DIAMOX) 125 MG tablet Take by mouth.  08/28/16  Yes [provider]  Calcium Carbonate 1500 (600 CA) MG TABS Take 1 tablet by mouth daily at 2 PM daily at 2 PM.   Yes [provider]  celecoxib (CELEBREX) 100 MG capsule Take 100 mg by mouth as needed. 01/23/21  Yes [provider]  ciprofloxacin (CIPRO) 500 MG tablet Take 1 tablet (500 mg total) by mouth 2 (two) times daily. 12/01/22  Yes Mabelle Mungin, DO  cycloSPORINE (RESTASIS) 0.05 % ophthalmic emulsion 1 drop 2 (two) times daily. 03/26/21  Yes [provider]  hydroxychloroquine (PLAQUENIL) 200 MG tablet Take 200 mg by mouth 2 (two) times daily. 05/03/22  Yes [provider]  levothyroxine (SYNTHROID) 112 MCG tablet TAKE 1 TABLET(112 MCG) BY MOUTH DAILY BEFORE BREAKFAST 09/14/22  Yes Glean Hess, MD  MULTIPLE VITAMIN PO Take by mouth.   Yes [provider]  RINVOQ 15 MG TB24 Take 1 tablet by mouth daily. 02/23/21  Yes [provider]  venlafaxine XR (EFFEXOR-XR) 37.5 MG 24 hr capsule Take 37.5 mg by mouth daily. 03/30/22  Yes [provider]    Family History Family History  Problem Relation Age of Onset   Hypertension Mother    Hypertension Father    Hyperlipidemia Father    Colon polyps Father    Diabetes Mellitus I Son    Breast cancer Neg Hx     Social History Social History   Tobacco Use  Smoking status: Never   Smokeless tobacco: Never  Vaping Use   Vaping Use: Never used  Substance Use Topics   Alcohol use: Yes    Alcohol/week: 0.0 standard drinks of alcohol   Drug use: No     Allergies   Propoxyphene, Fluconazole, Methotrexate derivatives, and Clarithromycin   Review of Systems Review of Systems :negative unless otherwise stated in HPI.      Physical Exam Triage Vital Signs ED Triage Vitals  Enc Vitals Group     BP 12/01/22 1634 (!) 142/73     Pulse Rate 12/01/22 1634 63     Resp 12/01/22 1634 16     Temp 12/01/22 1634 97.9 F (36.6 C)     Temp Source 12/01/22 1634  Oral     SpO2 12/01/22 1634 96 %     Weight 12/01/22 1633 170 lb (77.1 kg)     Height 12/01/22 1633 '5\' 6"'$  (1.676 m)     Head Circumference --      Peak Flow --      Pain Score 12/01/22 1633 0     Pain Loc --      Pain Edu? --      Excl. in [place]? --    No data found.  Updated Vital Signs BP (!) 142/73 (BP Location: Left Arm)   Pulse 63   Temp 97.9 F (36.6 C) (Oral)   Resp 16   Ht '5\' 6"'$  (1.676 m)   Wt 77.1 kg   SpO2 96%   BMI 27.44 kg/m   Visual Acuity Right Eye Distance:   Left Eye Distance:   Bilateral Distance:    Right Eye Near:   Left Eye Near:    Bilateral Near:     Physical Exam  GEN: pleasant non-ill appearing female, in no acute distress  CV: regular rate and rhythm RESP: no increased work of breathing, clear to ascultation bilaterally ABD: Bowel sounds present. Soft, non-tender, non-distended.  No guarding, no rebound, no appreciable hepatosplenomegaly, negative McBurney's, negative Murphy MSK: no extremity edema SKIN: warm, dry, no rash on visible skin NEURO: alert, moves all extremities appropriately PSYCH: Normal affect, appropriate speech and behavior   UC Treatments / Results  Labs (all labs ordered are listed, but only abnormal results are displayed) Labs Reviewed  CBC WITH DIFFERENTIAL/PLATELET - Abnormal; Notable for the following components:      Result Value   RBC 3.84 (*)    All other components within normal limits  COMPREHENSIVE METABOLIC PANEL - Abnormal; Notable for the following components:   CO2 20 (*)    Calcium 8.8 (*)    Alkaline Phosphatase 37 (*)    All other components within normal limits    EKG   Radiology No results found.  Procedures Procedures (including critical care time)  Medications Ordered in UC Medications - No data to display  Initial Impression / Assessment and Plan / UC Course  I have reviewed the triage vital signs and the nursing notes.  Pertinent labs & imaging results that were available during  my care of the patient were reviewed by me and considered in my medical decision making (see chart for details).        Patient is a  57 y.o. female who presents after having insidious diarrhea after returning for [place].   Overall, patient is non-ill-appearing, well-hydrated, and in no acute distress.  Vital signs stable.  [place]leta is afebrile.  Exam is not concerning for an acute abdomen.  Obtained  CBC and CMP.    CBC and CMP grossly unremarkable.  Will treat patient for presumed traveler's diarrhea with Cipro twice daily for 7 days.  Advised patient to avoid using the Imodium as this may make her diarrhea last longer.  Follow-up, return and ED precautions given.  Discussed MDM, treatment plan and plan for follow-up with patient who agrees with plan.    Final Clinical Impressions(s) / UC Diagnoses   Final diagnoses:  Diarrhea of presumed infectious origin     Discharge Instructions      Your blood work did not show cause of your diarrhea. As you recently traveled, we will treat you for travelers diarrhea with Ciprofloxacin.  Stop by the pharmacy to pick up your prescriptions.  Follow up with your primary care provider as needed.      ED Prescriptions     Medication Sig Dispense Auth. Provider   ciprofloxacin (CIPRO) 500 MG tablet Take 1 tablet (500 mg total) by mouth 2 (two) times daily. 14 tablet Tersa Fotopoulos, Ronnette Juniper, DO      PDMP not reviewed this encounter.   Lyndee Hensen, DO 12/01/22 1805

## 2022-12-01 NOTE — ED Triage Notes (Addendum)
Pt c/o diarrhea x5 days, recent travel to [place] last wk. Has been eating a bland diet & taking immodium w/minor relief. Denies any emesis or nausea.

## 2022-12-01 NOTE — Discharge Instructions (Addendum)
Your blood work did not show cause of your diarrhea. As you recently traveled, we will treat you for travelers diarrhea with Ciprofloxacin.  Stop by the pharmacy to pick up your prescriptions.  Follow up with your primary care provider as needed.

## 2022-12-21 ENCOUNTER — Other Ambulatory Visit: Payer: Self-pay

## 2022-12-21 ENCOUNTER — Other Ambulatory Visit: Payer: Self-pay | Admitting: Internal Medicine

## 2022-12-21 DIAGNOSIS — E034 Atrophy of thyroid (acquired): Secondary | ICD-10-CM

## 2022-12-21 NOTE — Telephone Encounter (Signed)
Requested medications are due for refill today.  yes  Requested medications are on the active medications list.  yes  Last refill. 09/14/2022 #90 0 rf  Future visit scheduled.   yes  Notes to clinic.  Abnormal labs    Requested Prescriptions  Pending Prescriptions Disp Refills   levothyroxine (SYNTHROID) 112 MCG tablet [Pharmacy Med Name: LEVOTHYROXINE 0.112MG  (112MCG) TABS] 90 tablet 0    Sig: TAKE 1 TABLET(112 MCG) BY MOUTH DAILY BEFORE BREAKFAST     Endocrinology:  Hypothyroid Agents Failed - 12/21/2022  6:19 AM      Failed - TSH in normal range and within 360 days    TSH  Date Value Ref Range Status  05/11/2022 0.361 (L) 0.450 - 4.500 uIU/mL Final         Passed - Valid encounter within last 12 months    Recent Outpatient Visits           7 months ago Annual physical exam   [place] at [facility], Jesse Sans, MD   1 year ago Annual physical exam   [facility] at [facility], Jesse Sans, MD   1 year ago Laryngitis, acute   [place] [facility] at [facility], Jesse Sans, MD   2 years ago Annual physical exam   [place] at [facility], Jesse Sans, MD   2 years ago Arthralgia of both hands   [place] at [facility], Jesse Sans, MD       Future Appointments             In 6 months Army Melia, Jesse Sans, MD [place] at [facility], [facility]

## 2023-01-15 ENCOUNTER — Other Ambulatory Visit: Payer: Self-pay | Admitting: Internal Medicine

## 2023-01-15 DIAGNOSIS — E034 Atrophy of thyroid (acquired): Secondary | ICD-10-CM

## 2023-04-11 ENCOUNTER — Other Ambulatory Visit: Payer: Self-pay | Admitting: Internal Medicine

## 2023-04-11 DIAGNOSIS — Z1231 Encounter for screening mammogram for malignant neoplasm of breast: Secondary | ICD-10-CM

## 2023-05-17 ENCOUNTER — Encounter: Payer: BC Managed Care – PPO | Admitting: Internal Medicine

## 2023-05-29 ENCOUNTER — Ambulatory Visit
Admission: RE | Admit: 2023-05-29 | Discharge: 2023-05-29 | Disposition: A | Discharge status: Home / Self Care | Payer: BC Managed Care – PPO | Source: Ambulatory Visit | Attending: Internal Medicine | Admitting: Internal Medicine

## 2023-05-29 DIAGNOSIS — Z1231 Encounter for screening mammogram for malignant neoplasm of breast: Secondary | ICD-10-CM | POA: Diagnosis present

## 2023-06-04 HISTORY — PX: OTHER SURGICAL HISTORY: SHX169

## 2023-06-18 ENCOUNTER — Other Ambulatory Visit: Payer: Self-pay | Admitting: Internal Medicine

## 2023-06-19 ENCOUNTER — Ambulatory Visit (INDEPENDENT_AMBULATORY_CARE_PROVIDER_SITE_OTHER): Payer: BC Managed Care – PPO | Admitting: Internal Medicine

## 2023-06-19 ENCOUNTER — Encounter: Payer: Self-pay | Admitting: Internal Medicine

## 2023-06-19 VITALS — BP 118/76 | HR 68 | Ht 66.0 in | Wt 181.2 lb

## 2023-06-19 DIAGNOSIS — E236 Other disorders of pituitary gland: Secondary | ICD-10-CM

## 2023-06-19 DIAGNOSIS — Z0000 Encounter for general adult medical examination without abnormal findings: Secondary | ICD-10-CM

## 2023-06-19 DIAGNOSIS — E034 Atrophy of thyroid (acquired): Secondary | ICD-10-CM

## 2023-06-19 DIAGNOSIS — Z23 Encounter for immunization: Secondary | ICD-10-CM

## 2023-06-19 DIAGNOSIS — M05732 Rheumatoid arthritis with rheumatoid factor of left wrist without organ or systems involvement: Secondary | ICD-10-CM

## 2023-06-19 DIAGNOSIS — E782 Mixed hyperlipidemia: Secondary | ICD-10-CM

## 2023-06-19 DIAGNOSIS — M05731 Rheumatoid arthritis with rheumatoid factor of right wrist without organ or systems involvement: Secondary | ICD-10-CM | POA: Diagnosis not present

## 2023-06-19 DIAGNOSIS — G43109 Migraine with aura, not intractable, without status migrainosus: Secondary | ICD-10-CM

## 2023-06-19 NOTE — Assessment & Plan Note (Signed)
On Celebrex, Plaquenil and Rinvoq Followed by Rheumatology

## 2023-06-19 NOTE — Patient Instructions (Signed)
Recommend Probiotic daily to help regular bowels

## 2023-06-19 NOTE — Progress Notes (Signed)
Date:  06/19/2023   Name:  Cindy Fowler   DOB:  1965/12/10   MRN:  [id number]   Chief Complaint: Annual Exam Cindy Fowler is a 57 y.o. female who presents today for her Complete Annual Exam. She feels well. She reports exercising. She reports she is sleeping well. Breast complaints - none.  Mammogram: 05/2023 DEXA: none Colonoscopy: 08/2019 repeat 5 yrs.  Pap: 05/2022 neg/neg  Health Maintenance Due  Topic Date Due   COVID-19 Vaccine (4 - 2023-24 season) 05/27/2023    Immunization History  Administered Date(s) Administered   Influenza Inj Mdck Quad Pf 07/07/2018   Influenza, Seasonal, Injecte, Preservative Fre 06/19/2023   Influenza,inj,Quad PF,6+ Mos 06/18/2019   Moderna SARS-COV2 Booster Vaccination 05/28/2020, 11/07/2020   Moderna Sars-Covid-2 Vaccination 11/22/2019, 12/20/2019   Pfizer Covid Bivalent Pediatric Vaccine(71mos to <81yrs) 07/09/2021   Pneumococcal Polysaccharide-23 01/24/2012   Tdap 04/24/2019   Zoster Recombinant(Shingrix) 07/11/2020, 10/01/2020    Thyroid Problem Presents for follow-up visit. Patient reports no anxiety, constipation, diarrhea, fatigue, palpitations or tremors. The symptoms have been stable.   RA - stable controlled joint symptoms - primarily wrists and ankles.  Recent CBC, Cr, LFTs reviewed and are normal.  Continues on Plaquenil, Celebrex and Rinvoq.  HA - on daily preventative from Neurology.  Headaches are controlled, last one about 6 mo ago.  Lab Results  Component Value Date   NA 135 12/01/2022   K 3.6 12/01/2022   CO2 20 (L) 12/01/2022   GLUCOSE 93 12/01/2022   BUN 14 12/01/2022   CREATININE 0.65 12/01/2022   CALCIUM 8.8 (L) 12/01/2022   EGFR 100 05/11/2022   GFRNONAA >60 12/01/2022   Lab Results  Component Value Date   CHOL 294 (H) 05/11/2022   HDL 98 05/11/2022   LDLCALC 173 (H) 05/11/2022   TRIG 134 05/11/2022   CHOLHDL 3.0 05/11/2022   Lab Results  Component Value Date   TSH 0.361 (L) 05/11/2022    Lab Results  Component Value Date   HGBA1C 5.6 05/11/2022   Lab Results  Component Value Date   WBC 4.7 12/01/2022   HGB 12.4 12/01/2022   HCT 36.8 12/01/2022   MCV 95.8 12/01/2022   PLT 187 12/01/2022   Lab Results  Component Value Date   ALT 20 12/01/2022   AST 23 12/01/2022   ALKPHOS 37 (L) 12/01/2022   BILITOT 0.6 12/01/2022   No results found for: "25OHVITD2", "25OHVITD3", "VD25OH"   Review of Systems  Constitutional:  Negative for chills, fatigue and fever.  HENT:  Positive for hearing loss (had hearing aids). Negative for congestion and trouble swallowing.   Eyes:  Negative for visual disturbance.  Respiratory:  Negative for cough, chest tightness, shortness of breath and wheezing.   Cardiovascular:  Negative for chest pain, palpitations and leg swelling.  Gastrointestinal:  Negative for abdominal pain, constipation and diarrhea.       Mild change in bowel habits - mild constipation --- loose stools  Endocrine: Negative for polydipsia and polyuria.  Genitourinary:  Negative for dysuria, frequency, genital sores, vaginal bleeding and vaginal discharge.  Musculoskeletal:  Positive for arthralgias. Negative for gait problem and joint swelling.  Skin:  Negative for color change and rash.  Neurological:  Positive for headaches. Negative for dizziness, tremors and light-headedness.  Hematological:  Negative for adenopathy. Does not bruise/bleed easily.  Psychiatric/Behavioral:  Negative for dysphoric mood and sleep disturbance. The patient is not nervous/anxious.     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   Rheumatoid arthritis (HCC) 04/14/2015     Allergies  Allergen Reactions   Propoxyphene Nausea And Vomiting   Fluconazole Swelling    eyes   Methotrexate Derivatives Hives   Clarithromycin Palpitations and Anxiety    Shakey, GI upset    Past Surgical History:  Procedure Laterality Date   ADENOIDECTOMY     carpal tunnel release Bilateral 06/04/2023   CATARACT EXTRACTION Bilateral 09/25/2013   CESAREAN SECTION  09/25/1992   DILATION AND CURETTAGE OF UTERUS     TONSILLECTOMY     TUBAL LIGATION      Social History   Tobacco Use   Smoking status: Never   Smokeless tobacco: Never  Vaping Use   Vaping status: Never Used  Substance Use Topics   Alcohol use: Yes    Alcohol/week: 0.0 standard drinks of alcohol   Drug use: No     Medication list has been reviewed and updated.  Current Meds  Medication Sig   acetaZOLAMIDE (DIAMOX) 125 MG tablet Take by mouth.   Calcium Carbonate 1500 (600 CA) MG TABS Take 1 tablet by mouth daily at 2 PM daily at 2 PM.   celecoxib (CELEBREX) 100 MG capsule Take 100 mg by mouth as needed.   cycloSPORINE (RESTASIS) 0.05 % ophthalmic emulsion 1 drop 2 (two) times daily.   hydroxychloroquine (PLAQUENIL) 200 MG tablet Take 200 mg by mouth 2 (two) times daily.   levothyroxine (SYNTHROID) 112 MCG tablet TAKE 1 TABLET(112 MCG) BY MOUTH DAILY BEFORE BREAKFAST   RINVOQ 15 MG TB24 Take 1 tablet by mouth daily.   venlafaxine XR (EFFEXOR-XR) 37.5 MG 24 hr capsule Take 37.5 mg by mouth daily.       06/19/2023    9:32 AM 05/11/2022    9:55 AM 05/09/2021    8:09 AM 03/07/2021   11:17 AM  GAD 7 : Generalized Anxiety Score  Nervous, Anxious, on Edge 0 0 0 0  Control/stop worrying 0 0 0 0  Worry too much - different things 0 0 0 0  Trouble relaxing 0 0 0 0  Restless 0 0 0 0  Easily annoyed or irritable 0 0 0 0  Afraid - awful might happen 0 0 0 0  Total GAD 7 Score 0 0 0 0  Anxiety Difficulty Not difficult at all Not difficult at all         06/19/2023    9:32 AM 05/11/2022    9:55 AM  05/09/2021    8:08 AM  Depression screen PHQ 2/9  Decreased Interest 0 0 0  Down, Depressed, Hopeless 0 0 1  PHQ - 2 Score 0 0 1  Altered sleeping 0 0 0  Tired, decreased energy 0 0 0  Change in appetite 0 0 0  Feeling bad or failure about yourself  0 0 0  Trouble concentrating 0 0 0  Moving slowly or fidgety/restless 0 0 0  Suicidal thoughts 0 0 0  PHQ-9 Score 0 0 1  Difficult doing work/chores Not difficult at all Not difficult at all Not difficult at all    BP Readings from Last 3 Encounters:  06/19/23 118/76  12/01/22 (!) 142/73  05/11/22 132/72  Physical Exam Vitals and nursing note reviewed.  Constitutional:      General: She is not in acute distress.    Appearance: She is well-developed.  HENT:     Head: Normocephalic and atraumatic.     Right Ear: Tympanic membrane and ear canal normal.     Left Ear: Tympanic membrane and ear canal normal.     Nose:     Right Sinus: No maxillary sinus tenderness.     Left Sinus: No maxillary sinus tenderness.  Eyes:     General: No scleral icterus.       Right eye: No discharge.        Left eye: No discharge.     Conjunctiva/sclera: Conjunctivae normal.  Neck:     Thyroid: No thyromegaly.     Vascular: No carotid bruit.  Cardiovascular:     Rate and Rhythm: Normal rate and regular rhythm.     Pulses: Normal pulses.     Heart sounds: Normal heart sounds.  Pulmonary:     Effort: Pulmonary effort is normal. No respiratory distress.     Breath sounds: No wheezing.  Chest:  Breasts:    Right: No mass, nipple discharge, skin change or tenderness.     Left: No mass, nipple discharge, skin change or tenderness.  Abdominal:     General: Bowel sounds are normal.     Palpations: Abdomen is soft.     Tenderness: There is no abdominal tenderness.  Musculoskeletal:     Cervical back: Normal range of motion. No erythema.     Right lower leg: No edema.     Left lower leg: No edema.  Lymphadenopathy:     Cervical: No cervical  adenopathy.  Skin:    General: Skin is warm and dry.     Findings: No rash.  Neurological:     Mental Status: She is alert and oriented to person, place, and time.     Cranial Nerves: No cranial nerve deficit.     Sensory: No sensory deficit.     Deep Tendon Reflexes: Reflexes are normal and symmetric.  Psychiatric:        Attention and Perception: Attention normal.        Mood and Affect: Mood normal.     Wt Readings from Last 3 Encounters:  06/19/23 181 lb 3.2 oz (82.2 kg)  12/01/22 170 lb (77.1 kg)  05/11/22 171 lb (77.6 kg)    BP 118/76   Pulse 68   Ht 5\' 6"  (1.676 m)   Wt 181 lb 3.2 oz (82.2 kg)   SpO2 97%   BMI 29.25 kg/m   Assessment and Plan:  Problem List Items Addressed This Visit       Unprioritized   Rheumatoid arthritis (HCC) (Chronic)    On Celebrex, Plaquenil and Rinvoq Followed by Rheumatology      Mixed hyperlipidemia (Chronic)    Managed with diet and exercise Lab Results  Component Value Date   LDLCALC 173 (H) 05/11/2022         Relevant Orders   Lipid panel   Hypothyroidism (Chronic)    Supplemented Lab Results  Component Value Date   TSH 0.361 (L) 05/11/2022         Relevant Orders   TSH + free T4   Empty sella (HCC) (Chronic)   Basilar artery migraine (Chronic)    Treated by [facility] On Effexor and acetazolamide daily      Other Visit Diagnoses  Annual physical exam    -  Primary   continue healthy diet and exercise screening and immunizations up to date can try probiotic for bowel regularity   Relevant Orders   TSH + free T4   Lipid panel   Need for influenza vaccination       Relevant Orders   Flu vaccine trivalent PF, 6mos and older(Flulaval,Afluria,Fluarix,Fluzone) (Completed)       No follow-ups on file.    Reubin Milan, MD [facility] [place]

## 2023-06-19 NOTE — Assessment & Plan Note (Signed)
Managed with diet and exercise Lab Results  Component Value Date   LDLCALC 173 (H) 05/11/2022

## 2023-06-19 NOTE — Assessment & Plan Note (Addendum)
Treated by [facility] On Effexor and acetazolamide daily

## 2023-06-19 NOTE — Assessment & Plan Note (Signed)
Supplemented Lab Results  Component Value Date   TSH 0.361 (L) 05/11/2022

## 2023-06-20 LAB — LIPID PANEL
Chol/HDL Ratio: 3.1 ratio (ref 0.0–4.4)
Cholesterol, Total: 296 mg/dL — ABNORMAL HIGH (ref 100–199)
HDL: 97 mg/dL (ref >39)
LDL Chol Calc (NIH): 179 mg/dL — ABNORMAL HIGH (ref 0–99)
Triglycerides: 118 mg/dL (ref 0–149)
VLDL Cholesterol Cal: 20 mg/dL (ref 5–40)

## 2023-06-20 LAB — TSH+FREE T4
Free T4: 1.7 ng/dL (ref 0.82–1.77)
TSH: 0.321 u[IU]/mL — ABNORMAL LOW (ref 0.450–4.500)

## 2023-10-10 ENCOUNTER — Other Ambulatory Visit: Payer: Self-pay | Admitting: Medical Genetics

## 2023-10-15 ENCOUNTER — Other Ambulatory Visit
Admission: RE | Admit: 2023-10-15 | Discharge: 2023-10-15 | Disposition: A | Discharge status: Home / Self Care | Payer: Self-pay | Source: Ambulatory Visit | Attending: Medical Genetics | Admitting: Medical Genetics

## 2023-10-26 LAB — GENECONNECT MOLECULAR SCREEN: Genetic Analysis Overall Interpretation: NEGATIVE

## 2023-12-25 ENCOUNTER — Other Ambulatory Visit: Payer: Self-pay | Admitting: Internal Medicine

## 2023-12-25 DIAGNOSIS — E034 Atrophy of thyroid (acquired): Secondary | ICD-10-CM

## 2023-12-27 NOTE — Telephone Encounter (Signed)
 Requested medications are due for refill today.  yes  Requested medications are on the active medications list.  yes  Last refill. 01/15/2023 #90 3 rf  Future visit scheduled.   yes  Notes to clinic.  Abnormal labs.    Requested Prescriptions  Pending Prescriptions Disp Refills   levothyroxine (SYNTHROID) 112 MCG tablet [Pharmacy Med Name: Levothyroxine Sodium Tablet] 90 tablet 1    Sig: Take 1 tablet by mouth daily before breakfast.     Endocrinology:  Hypothyroid Agents Failed - 12/27/2023 11:46 AM      Failed - TSH in normal range and within 360 days    TSH  Date Value Ref Range Status  06/19/2023 0.321 (L) 0.450 - 4.500 uIU/mL Final         Failed - Valid encounter within last 12 months    Recent Outpatient Visits   None     Future Appointments             In 5 months Judithann Graves, Nyoka Cowden, MD [facility] at [facility], [facility]

## 2024-01-11 ENCOUNTER — Encounter: Payer: Self-pay | Admitting: Internal Medicine

## 2024-01-11 ENCOUNTER — Ambulatory Visit: Admitting: Internal Medicine

## 2024-01-11 VITALS — BP 136/80 | HR 88 | Temp 98.2°F | Ht 66.0 in | Wt 178.2 lb

## 2024-01-11 DIAGNOSIS — J0101 Acute recurrent maxillary sinusitis: Secondary | ICD-10-CM | POA: Diagnosis not present

## 2024-01-11 LAB — POCT INFLUENZA A/B
Influenza A, POC: NEGATIVE
Influenza B, POC: NEGATIVE

## 2024-01-11 LAB — POC COVID19 BINAXNOW: SARS Coronavirus 2 Ag: NEGATIVE

## 2024-01-11 MED ORDER — AMOXICILLIN-POT CLAVULANATE 875-125 MG PO TABS: 1.0000 | ORAL_TABLET | Freq: Two times a day (BID) | ORAL | 0 refills | Status: AC

## 2024-01-11 NOTE — Patient Instructions (Signed)
 Get Flonase or Nasonex nasal spray and use daily for a few weeks

## 2024-01-11 NOTE — Progress Notes (Signed)
 Date:  01/11/2024   Name:  Cindy Fowler   DOB:  [birth year]   MRN:  [id number]   Chief Complaint: Sore Throat (Patient said she thinks she had the flu at the end of march, got better but now she is feeling worse), Headache, and Cough  URI  This is a new problem. The current episode started yesterday. The problem has been gradually worsening. There has been no fever. Associated symptoms include congestion, coughing, ear pain, headaches, a plugged ear sensation, sinus pain and a sore throat. Pertinent negatives include no chest pain, nausea or wheezing. She has tried NSAIDs for the symptoms. The treatment provided mild relief.  She had a flu like illness about 3 weeks ago and never really regained her energy.  Yesterday started to feel congested and today much worse.  Review of Systems  Constitutional:  Positive for fatigue. Negative for fever.  HENT:  Positive for congestion, ear pain, sinus pain and sore throat.   Respiratory:  Positive for cough. Negative for shortness of breath and wheezing.   Cardiovascular:  Negative for chest pain and palpitations.  Gastrointestinal:  Negative for nausea.  Neurological:  Positive for headaches. Negative for dizziness.     Lab Results  Component Value Date   NA 135 12/01/2022   K 3.6 12/01/2022   CO2 20 (L) 12/01/2022   GLUCOSE 93 12/01/2022   BUN 14 12/01/2022   CREATININE 0.65 12/01/2022   CALCIUM 8.8 (L) 12/01/2022   EGFR 100 05/11/2022   GFRNONAA >60 12/01/2022   Lab Results  Component Value Date   CHOL 296 (H) 06/19/2023   HDL 97 06/19/2023   LDLCALC 179 (H) 06/19/2023   TRIG 118 06/19/2023   CHOLHDL 3.1 06/19/2023   Lab Results  Component Value Date   TSH 0.321 (L) 06/19/2023   Lab Results  Component Value Date   HGBA1C 5.6 05/11/2022   Lab Results  Component Value Date   WBC 4.7 12/01/2022   HGB 12.4 12/01/2022   HCT 36.8 12/01/2022   MCV 95.8 12/01/2022   PLT 187 12/01/2022   Lab Results  Component Value  Date   ALT 20 12/01/2022   AST 23 12/01/2022   ALKPHOS 37 (L) 12/01/2022   BILITOT 0.6 12/01/2022   No results found for: MARIEN BOLLS, VD25OH   Patient Active Problem List   Diagnosis Date Noted   PVD (posterior vitreous detachment), bilateral 09/27/2021   History of colonic polyps 01/20/2019   Chronic pain of both knees 04/22/2018   Mixed hyperlipidemia 04/14/2015   Hypothyroidism 04/14/2015   Empty sella (HCC) 04/14/2015   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   Rheumatoid arthritis (HCC) 04/14/2015    Allergies  Allergen Reactions   Propoxyphene Nausea And Vomiting   Fluconazole Swelling    eyes   Methotrexate Derivatives Hives   Clarithromycin Palpitations and Anxiety    Shakey, GI upset    Past Surgical History:  Procedure Laterality Date   ADENOIDECTOMY     carpal tunnel release Bilateral 06/04/2023   CATARACT EXTRACTION Bilateral 09/25/2013   CESAREAN SECTION  09/25/1992   DILATION AND CURETTAGE OF UTERUS     TONSILLECTOMY     TUBAL LIGATION      Social History   Tobacco Use   Smoking status: Never   Smokeless tobacco: Never  Vaping Use   Vaping status: Never Used  Substance Use Topics   Alcohol use: Yes    Alcohol/week: 0.0 standard drinks  of alcohol   Drug use: No     Medication list has been reviewed and updated.  Current Meds  Medication Sig   acetaZOLAMIDE (DIAMOX) 125 MG tablet Take by mouth.   amoxicillin -clavulanate (AUGMENTIN ) 875-125 MG tablet Take 1 tablet by mouth 2 (two) times daily for 10 days.   Calcium Carbonate 1500 (600 CA) MG TABS Take 1 tablet by mouth daily at 2 PM daily at 2 PM.   celecoxib (CELEBREX) 100 MG capsule Take 100 mg by mouth as needed.   cycloSPORINE (RESTASIS) 0.05 % ophthalmic emulsion 1 drop 2 (two) times daily.   hydroxychloroquine (PLAQUENIL) 200 MG tablet Take 200 mg by mouth 2 (two) times daily.   levothyroxine  (SYNTHROID ) 112 MCG tablet Take 1 tablet by mouth  daily before breakfast.   RINVOQ 15 MG TB24 Take 1 tablet by mouth daily.   venlafaxine XR (EFFEXOR-XR) 37.5 MG 24 hr capsule Take 37.5 mg by mouth daily.       06/19/2023    9:32 AM 05/11/2022    9:55 AM 05/09/2021    8:09 AM 03/07/2021   11:17 AM  GAD 7 : Generalized Anxiety Score  Nervous, Anxious, on Edge 0 0 0 0  Control/stop worrying 0 0 0 0  Worry too much - different things 0 0 0 0  Trouble relaxing 0 0 0 0  Restless 0 0 0 0  Easily annoyed or irritable 0 0 0 0  Afraid - awful might happen 0 0 0 0  Total GAD 7 Score 0 0 0 0  Anxiety Difficulty Not difficult at all Not difficult at all         06/19/2023    9:32 AM 05/11/2022    9:55 AM 05/09/2021    8:08 AM  Depression screen PHQ 2/9  Decreased Interest 0 0 0  Down, Depressed, Hopeless 0 0 1  PHQ - 2 Score 0 0 1  Altered sleeping 0 0 0  Tired, decreased energy 0 0 0  Change in appetite 0 0 0  Feeling bad or failure about yourself  0 0 0  Trouble concentrating 0 0 0  Moving slowly or fidgety/restless 0 0 0  Suicidal thoughts 0 0 0  PHQ-9 Score 0 0 1  Difficult doing work/chores Not difficult at all Not difficult at all Not difficult at all    BP Readings from Last 3 Encounters:  01/11/24 136/80  06/19/23 118/76  12/01/22 (!) 142/73    Physical Exam Constitutional:      Appearance: She is well-developed.  HENT:     Right Ear: Ear canal and external ear normal. Tympanic membrane is retracted. Tympanic membrane is not erythematous.     Left Ear: Ear canal and external ear normal. Tympanic membrane is retracted. Tympanic membrane is not erythematous.     Nose:     Right Sinus: Maxillary sinus tenderness and frontal sinus tenderness present.     Left Sinus: Maxillary sinus tenderness and frontal sinus tenderness present.     Mouth/Throat:     Mouth: No oral lesions.     Pharynx: Uvula midline. Posterior oropharyngeal erythema present. No oropharyngeal exudate.  Cardiovascular:     Rate and Rhythm: Normal rate  and regular rhythm.     Heart sounds: Normal heart sounds.  Pulmonary:     Breath sounds: Normal breath sounds. No wheezing or rales.  Lymphadenopathy:     Cervical: No cervical adenopathy.  Neurological:     Mental Status: She is  alert and oriented to person, place, and time.     Wt Readings from Last 3 Encounters:  01/11/24 178 lb 4 oz (80.9 kg)  06/19/23 181 lb 3.2 oz (82.2 kg)  12/01/22 170 lb (77.1 kg)    BP 136/80   Pulse 88   Temp 98.2 F (36.8 C)   Ht 5' 6 (1.676 m)   Wt 178 lb 4 oz (80.9 kg)   SpO2 96%   BMI 28.77 kg/m   Assessment and Plan:  Problem List Items Addressed This Visit   None Visit Diagnoses       Acute recurrent maxillary sinusitis    -  Primary   continue Advil and Benadryl for symptom relief start Flonase or Nasonex Augmentin  Rx   Relevant Medications   amoxicillin -clavulanate (AUGMENTIN ) 875-125 MG tablet   Other Relevant Orders   POC COVID-19 BinaxNow (Completed)   POCT Influenza A/B (Completed)       No follow-ups on file.    Leita HILARIO Adie, MD [facility] [place]

## 2024-01-30 ENCOUNTER — Encounter: Payer: Self-pay | Admitting: Internal Medicine

## 2024-01-30 NOTE — Telephone Encounter (Signed)
 Please review and advise.   JM

## 2024-03-29 ENCOUNTER — Other Ambulatory Visit: Payer: Self-pay | Admitting: Internal Medicine

## 2024-03-29 DIAGNOSIS — E034 Atrophy of thyroid (acquired): Secondary | ICD-10-CM

## 2024-04-01 NOTE — Telephone Encounter (Signed)
 Requested medication (s) are due for refill today: yes  Requested medication (s) are on the active medication list: yes  Last refill:  01/06/24 #30  Future visit scheduled: yes  Notes to clinic:  overdue appt (last OV 924/24) needs appt- abnormal TSH needs labs   Requested Prescriptions  Pending Prescriptions Disp Refills   levothyroxine  (SYNTHROID ) 112 MCG tablet [Pharmacy Med Name: Levothyroxine  Sodium Tablet] 30 tablet 0    Sig: Take 1 tablet by mouth daily before breakfast. Please schedule an appointment.     Endocrinology:  Hypothyroid Agents Failed - 04/01/2024 12:40 PM      Failed - TSH in normal range and within 360 days    TSH  Date Value Ref Range Status  06/19/2023 0.321 (L) 0.450 - 4.500 uIU/mL Final         Failed - Valid encounter within last 12 months    Recent Outpatient Visits           2 months ago Acute recurrent maxillary sinusitis   [place] [facility] at [facility], Leita DEL, MD       Future Appointments             In 2 months Justus, Leita DEL, MD [facility] at [facility], [facility]

## 2024-04-28 ENCOUNTER — Other Ambulatory Visit: Payer: Self-pay | Admitting: Internal Medicine

## 2024-04-28 DIAGNOSIS — Z1231 Encounter for screening mammogram for malignant neoplasm of breast: Secondary | ICD-10-CM

## 2024-04-30 ENCOUNTER — Other Ambulatory Visit: Payer: Self-pay | Admitting: Internal Medicine

## 2024-04-30 DIAGNOSIS — E034 Atrophy of thyroid (acquired): Secondary | ICD-10-CM

## 2024-05-01 ENCOUNTER — Encounter: Payer: Self-pay | Admitting: Internal Medicine

## 2024-05-01 ENCOUNTER — Other Ambulatory Visit: Payer: Self-pay

## 2024-05-27 ENCOUNTER — Other Ambulatory Visit: Payer: Self-pay | Admitting: Internal Medicine

## 2024-05-27 DIAGNOSIS — E034 Atrophy of thyroid (acquired): Secondary | ICD-10-CM

## 2024-05-28 NOTE — Telephone Encounter (Signed)
 Requested medication (s) are due for refill today: yes   Requested medication (s) are on the active medication list: yes   Last refill:  05/01/24 #30 0 refills  Future visit scheduled: yes in 3 weeks   Notes to clinic:  no refills remain. Do you want to refill Rx?     Requested Prescriptions  Pending Prescriptions Disp Refills   levothyroxine  (SYNTHROID ) 112 MCG tablet [Pharmacy Med Name: Levothyroxine  Sodium 112mcg Tablet] 30 tablet 0    Sig: Take 1 tablet by mouth daily before breakfast. Please schedule an appointment.     Endocrinology:  Hypothyroid Agents Failed - 05/28/2024 11:36 AM      Failed - TSH in normal range and within 360 days    TSH  Date Value Ref Range Status  06/19/2023 0.321 (L) 0.450 - 4.500 uIU/mL Final         Failed - Valid encounter within last 12 months    Recent Outpatient Visits           4 months ago Acute recurrent maxillary sinusitis   [place] [facility] at [facility], Leita DEL, MD       Future Appointments             In 3 weeks Justus, Leita DEL, MD [facility] at [facility], [phone] [place]

## 2024-06-02 ENCOUNTER — Ambulatory Visit
Admission: RE | Admit: 2024-06-02 | Discharge: 2024-06-02 | Disposition: A | Discharge status: Home / Self Care | Source: Ambulatory Visit | Attending: Internal Medicine | Admitting: Internal Medicine

## 2024-06-02 DIAGNOSIS — Z1231 Encounter for screening mammogram for malignant neoplasm of breast: Secondary | ICD-10-CM | POA: Insufficient documentation

## 2024-06-20 ENCOUNTER — Encounter: Payer: Self-pay | Admitting: Internal Medicine

## 2024-06-20 ENCOUNTER — Ambulatory Visit: Payer: Self-pay | Admitting: Internal Medicine

## 2024-06-20 VITALS — BP 132/78 | HR 69 | Ht 66.0 in | Wt 172.0 lb

## 2024-06-20 DIAGNOSIS — Z131 Encounter for screening for diabetes mellitus: Secondary | ICD-10-CM

## 2024-06-20 DIAGNOSIS — Z1231 Encounter for screening mammogram for malignant neoplasm of breast: Secondary | ICD-10-CM

## 2024-06-20 DIAGNOSIS — M05731 Rheumatoid arthritis with rheumatoid factor of right wrist without organ or systems involvement: Secondary | ICD-10-CM | POA: Diagnosis not present

## 2024-06-20 DIAGNOSIS — Z23 Encounter for immunization: Secondary | ICD-10-CM | POA: Diagnosis not present

## 2024-06-20 DIAGNOSIS — Z0000 Encounter for general adult medical examination without abnormal findings: Secondary | ICD-10-CM

## 2024-06-20 DIAGNOSIS — E034 Atrophy of thyroid (acquired): Secondary | ICD-10-CM | POA: Diagnosis not present

## 2024-06-20 DIAGNOSIS — E782 Mixed hyperlipidemia: Secondary | ICD-10-CM | POA: Diagnosis not present

## 2024-06-20 DIAGNOSIS — M05732 Rheumatoid arthritis with rheumatoid factor of left wrist without organ or systems involvement: Secondary | ICD-10-CM

## 2024-06-20 DIAGNOSIS — Z1211 Encounter for screening for malignant neoplasm of colon: Secondary | ICD-10-CM | POA: Diagnosis not present

## 2024-06-20 DIAGNOSIS — G43109 Migraine with aura, not intractable, without status migrainosus: Secondary | ICD-10-CM

## 2024-06-20 NOTE — Progress Notes (Signed)
 Date:  06/20/2024   Name:  Cindy Fowler   DOB:  October 16, 1965   MRN:  [id number]   Chief Complaint: Annual Exam Cindy Fowler is a 58 y.o. female who presents today for her Complete Annual Exam. She feels well. She reports exercising. She reports she is sleeping well. Breast complaints - none.  Health Maintenance  Topic Date Due   Hepatitis B Vaccine (1 of 3 - 19+ 3-dose series) Never done   Pneumococcal Vaccine for age over 63 (2 of 2 - PCV) 06/29/2016   COVID-19 Vaccine (5 - Mixed Product risk 2024-25 season) 05/26/2024   Colon Cancer Screening  08/28/2024   HIV Screening  05/09/2026*   Breast Cancer Screening  06/02/2025   Pap with HPV screening  06/13/2025   DTaP/Tdap/Td vaccine (2 - Td or Tdap) 04/23/2029   Flu Shot  Completed   Hepatitis C Screening  Completed   Zoster (Shingles) Vaccine  Completed   HPV Vaccine  Aged Out   Meningitis B Vaccine  Aged Out  *Topic was postponed. The date shown is not the original due date.    Thyroid  Problem Presents for follow-up visit. Patient reports no anxiety, constipation, diarrhea, fatigue or palpitations. The symptoms have been stable.  Migraine  This is a recurrent problem. The problem has been unchanged. The pain quality is similar to prior headaches. Pertinent negatives include no abdominal pain, coughing, dizziness or weakness.  RA - followed by Rheumatology - in Rinvoq, Plaquenil and Celebrex.  Morning ankle pain unchanged.  Review of Systems  Constitutional:  Negative for fatigue and unexpected weight change.  HENT:  Negative for trouble swallowing.   Eyes:  Negative for visual disturbance.  Respiratory:  Negative for cough, chest tightness, shortness of breath and wheezing.   Cardiovascular:  Negative for chest pain, palpitations and leg swelling.  Gastrointestinal:  Negative for abdominal pain, constipation and diarrhea.  Musculoskeletal:  Positive for arthralgias and joint swelling. Negative for myalgias.   Neurological:  Negative for dizziness, weakness, light-headedness and headaches.  Psychiatric/Behavioral:  Negative for dysphoric mood and sleep disturbance. The patient is not nervous/anxious.      Lab Results  Component Value Date   NA 135 12/01/2022   K 3.6 12/01/2022   CO2 20 (L) 12/01/2022   GLUCOSE 93 12/01/2022   BUN 14 12/01/2022   CREATININE 0.65 12/01/2022   CALCIUM 8.8 (L) 12/01/2022   EGFR 100 05/11/2022   GFRNONAA >60 12/01/2022   Lab Results  Component Value Date   CHOL 296 (H) 06/19/2023   HDL 97 06/19/2023   LDLCALC 179 (H) 06/19/2023   TRIG 118 06/19/2023   CHOLHDL 3.1 06/19/2023   Lab Results  Component Value Date   TSH 0.321 (L) 06/19/2023   Lab Results  Component Value Date   HGBA1C 5.6 05/11/2022   Lab Results  Component Value Date   WBC 4.7 12/01/2022   HGB 12.4 12/01/2022   HCT 36.8 12/01/2022   MCV 95.8 12/01/2022   PLT 187 12/01/2022   Lab Results  Component Value Date   ALT 20 12/01/2022   AST 23 12/01/2022   ALKPHOS 37 (L) 12/01/2022   BILITOT 0.6 12/01/2022   No results found for: MARIEN BOLLS, VD25OH   Patient Active Problem List   Diagnosis Date Noted   PVD (posterior vitreous detachment), bilateral 09/27/2021   History of colonic polyps 01/20/2019   Chronic pain of both knees 04/22/2018   Mixed hyperlipidemia 04/14/2015   Hypothyroidism 04/14/2015  Empty sella (HCC) 04/14/2015   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   Rheumatoid arthritis (HCC) 04/14/2015    Allergies  Allergen Reactions   Propoxyphene Nausea And Vomiting   Fluconazole Swelling    eyes   Methotrexate Derivatives Hives   Clarithromycin Palpitations and Anxiety    Shakey, GI upset    Past Surgical History:  Procedure Laterality Date   ADENOIDECTOMY     carpal tunnel release Bilateral 06/04/2023   CATARACT EXTRACTION Bilateral 09/25/2013   CESAREAN SECTION  09/25/1992   DILATION AND CURETTAGE OF  UTERUS     TONSILLECTOMY     TUBAL LIGATION      Social History   Tobacco Use   Smoking status: Never   Smokeless tobacco: Never  Vaping Use   Vaping status: Never Used  Substance Use Topics   Alcohol use: Yes    Alcohol/week: 0.0 standard drinks of alcohol   Drug use: No     Medication list has been reviewed and updated.  Current Meds  Medication Sig   acetaZOLAMIDE (DIAMOX) 125 MG tablet Take by mouth.   Calcium Carbonate 1500 (600 CA) MG TABS Take 1 tablet by mouth daily at 2 PM daily at 2 PM.   celecoxib (CELEBREX) 100 MG capsule Take 100 mg by mouth as needed.   cycloSPORINE (RESTASIS) 0.05 % ophthalmic emulsion 1 drop 2 (two) times daily.   hydroxychloroquine (PLAQUENIL) 200 MG tablet Take 200 mg by mouth 2 (two) times daily.   levothyroxine  (SYNTHROID ) 112 MCG tablet Take 1 tablet by mouth daily before breakfast. Please schedule an appointment.   RINVOQ 15 MG TB24 Take 1 tablet by mouth daily.   venlafaxine XR (EFFEXOR-XR) 37.5 MG 24 hr capsule Take 37.5 mg by mouth daily.       06/20/2024    8:33 AM 06/19/2023    9:32 AM 05/11/2022    9:55 AM 05/09/2021    8:09 AM  GAD 7 : Generalized Anxiety Score  Nervous, Anxious, on Edge 0 0 0 0  Control/stop worrying 0 0 0 0  Worry too much - different things 0 0 0 0  Trouble relaxing 0 0 0 0  Restless 0 0 0 0  Easily annoyed or irritable 0 0 0 0  Afraid - awful might happen 0 0 0 0  Total GAD 7 Score 0 0 0 0  Anxiety Difficulty Not difficult at all Not difficult at all Not difficult at all        06/20/2024    8:33 AM 06/19/2023    9:32 AM 05/11/2022    9:55 AM  Depression screen PHQ 2/9  Decreased Interest 0 0 0  Down, Depressed, Hopeless 0 0 0  PHQ - 2 Score 0 0 0  Altered sleeping 0 0 0  Tired, decreased energy 0 0 0  Change in appetite 0 0 0  Feeling bad or failure about yourself  0 0 0  Trouble concentrating 0 0 0  Moving slowly or fidgety/restless 0 0 0  Suicidal thoughts 0 0 0  PHQ-9 Score 0 0 0   Difficult doing work/chores Not difficult at all Not difficult at all Not difficult at all    BP Readings from Last 3 Encounters:  06/20/24 132/78  01/11/24 136/80  06/19/23 118/76    Physical Exam Vitals and nursing note reviewed.  Constitutional:      General: She is not in acute distress.    Appearance: She is well-developed.  HENT:     Head: Normocephalic and atraumatic.     Right Ear: Tympanic membrane and ear canal normal.     Left Ear: Tympanic membrane and ear canal normal.     Nose:     Right Sinus: No maxillary sinus tenderness.     Left Sinus: No maxillary sinus tenderness.  Eyes:     General: No scleral icterus.       Right eye: No discharge.        Left eye: No discharge.     Conjunctiva/sclera: Conjunctivae normal.  Neck:     Thyroid : No thyromegaly.     Vascular: No carotid bruit.  Cardiovascular:     Rate and Rhythm: Normal rate and regular rhythm.     Pulses: Normal pulses.     Heart sounds: Normal heart sounds.  Pulmonary:     Effort: Pulmonary effort is normal. No respiratory distress.     Breath sounds: No wheezing.  Abdominal:     General: Bowel sounds are normal.     Palpations: Abdomen is soft.     Tenderness: There is no abdominal tenderness.  Musculoskeletal:     Cervical back: Normal range of motion. No erythema.     Right lower leg: No edema.     Left lower leg: No edema.  Lymphadenopathy:     Cervical: No cervical adenopathy.  Skin:    General: Skin is warm and dry.     Capillary Refill: Capillary refill takes less than 2 seconds.     Findings: No rash.  Neurological:     General: No focal deficit present.     Mental Status: She is alert and oriented to person, place, and time.     Cranial Nerves: No cranial nerve deficit.     Sensory: No sensory deficit.     Deep Tendon Reflexes: Reflexes are normal and symmetric.  Psychiatric:        Attention and Perception: Attention normal.        Mood and Affect: Mood normal.         Behavior: Behavior normal.     Wt Readings from Last 3 Encounters:  06/20/24 172 lb (78 kg)  01/11/24 178 lb 4 oz (80.9 kg)  06/19/23 181 lb 3.2 oz (82.2 kg)    BP 132/78   Pulse 69   Ht 5' 6 (1.676 m)   Wt 172 lb (78 kg)   SpO2 99%   BMI 27.76 kg/m   Assessment and Plan:  Problem List Items Addressed This Visit       Unprioritized   Basilar artery migraine (Chronic)   No recent change in migraine headaches. Headaches respond well to current therapy with Effexor for prevention. Will continue regimen;  follow up if worsening.       Hypothyroidism (Chronic)   Supplemented.   Lab Results  Component Value Date   TSH 0.321 (L) 06/19/2023         Relevant Orders   TSH + free T4   Mixed hyperlipidemia (Chronic)   Relevant Orders   Lipid panel   Rheumatoid arthritis (HCC) (Chronic)   Followed by Rheumatology - primarily hands and ankles. Recent hepatic function normal.  CBC with slightly low WBC unchanged. Symptoms controlled on Rinvoq, Plaquenil and Celebrex.      Other Visit Diagnoses       Annual physical exam    -  Primary   Relevant Orders   Hemoglobin A1c   Lipid panel  TSH + free T4   Basic metabolic panel with GFR     Encounter for screening mammogram for breast cancer       recently completed     Colon cancer screening       she has a reminder letter - just needs to call.     Screening for diabetes mellitus       Relevant Orders   Hemoglobin A1c     Encounter for immunization       Relevant Orders   Flu vaccine trivalent PF, 6mos and older(Flulaval,Afluria,Fluarix,Fluzone) (Completed)       Return in about 1 year (around 06/20/2025) for CPX Dr. Lemon.    Leita HILARIO Adie, MD [facility] [place]

## 2024-06-20 NOTE — Assessment & Plan Note (Addendum)
 Supplemented.   Lab Results  Component Value Date   TSH 0.321 (L) 06/19/2023

## 2024-06-20 NOTE — Assessment & Plan Note (Addendum)
 Followed by Rheumatology - primarily hands and ankles. Recent hepatic function normal.  CBC with slightly low WBC unchanged. Symptoms controlled on Rinvoq, Plaquenil and Celebrex.

## 2024-06-20 NOTE — Assessment & Plan Note (Signed)
 No recent change in migraine headaches. Headaches respond well to current therapy with Effexor for prevention. Will continue regimen;  follow up if worsening.

## 2024-06-21 LAB — LIPID PANEL
Chol/HDL Ratio: 3.5 ratio (ref 0.0–4.4)
Cholesterol, Total: 262 mg/dL — ABNORMAL HIGH (ref 100–199)
HDL: 74 mg/dL (ref >39)
LDL Chol Calc (NIH): 176 mg/dL — ABNORMAL HIGH (ref 0–99)
Triglycerides: 72 mg/dL (ref 0–149)
VLDL Cholesterol Cal: 12 mg/dL (ref 5–40)

## 2024-06-21 LAB — BASIC METABOLIC PANEL WITH GFR
BUN/Creatinine Ratio: 19 (ref 9–23)
BUN: 11 mg/dL (ref 6–24)
CO2: 19 mmol/L — ABNORMAL LOW (ref 20–29)
Calcium: 9 mg/dL (ref 8.7–10.2)
Chloride: 106 mmol/L (ref 96–106)
Creatinine, Ser: 0.58 mg/dL (ref 0.57–1.00)
Glucose: 97 mg/dL (ref 70–99)
Potassium: 4.4 mmol/L (ref 3.5–5.2)
Sodium: 139 mmol/L (ref 134–144)
eGFR: 105 mL/min/1.73 (ref >59)

## 2024-06-21 LAB — TSH+FREE T4
Free T4: 1.47 ng/dL (ref 0.82–1.77)
TSH: 0.611 u[IU]/mL (ref 0.450–4.500)

## 2024-06-21 LAB — HEMOGLOBIN A1C
Est. average glucose Bld gHb Est-mCnc: 114 mg/dL
Hgb A1c MFr Bld: 5.6 % (ref 4.8–5.6)

## 2024-06-23 ENCOUNTER — Ambulatory Visit: Payer: Self-pay | Admitting: Internal Medicine

## 2024-06-23 DIAGNOSIS — E034 Atrophy of thyroid (acquired): Secondary | ICD-10-CM

## 2024-06-23 MED ORDER — LEVOTHYROXINE SODIUM 112 MCG PO TABS
112.0000 ug | ORAL_TABLET | Freq: Every day | ORAL | 3 refills | Status: DC
Start: 2024-06-23 — End: 2024-06-26

## 2024-06-26 ENCOUNTER — Other Ambulatory Visit: Payer: Self-pay

## 2024-06-26 ENCOUNTER — Encounter: Payer: Self-pay | Admitting: Internal Medicine

## 2024-06-26 DIAGNOSIS — E034 Atrophy of thyroid (acquired): Secondary | ICD-10-CM

## 2024-06-26 MED ORDER — LEVOTHYROXINE SODIUM 112 MCG PO TABS: 112.0000 ug | ORAL_TABLET | Freq: Every day | ORAL | 3 refills | Status: AC

## 2024-07-29 ENCOUNTER — Encounter: Payer: Self-pay | Admitting: Internal Medicine

## 2024-07-29 ENCOUNTER — Ambulatory Visit: Admitting: Internal Medicine

## 2024-07-29 VITALS — BP 124/70 | HR 103 | Temp 98.2°F | Ht 66.0 in | Wt 175.0 lb

## 2024-07-29 DIAGNOSIS — J0101 Acute recurrent maxillary sinusitis: Secondary | ICD-10-CM

## 2024-07-29 MED ORDER — PREDNISONE 10 MG PO TABS: ORAL_TABLET | ORAL | 0 refills | Status: AC

## 2024-07-29 MED ORDER — AMOXICILLIN-POT CLAVULANATE 875-125 MG PO TABS: 1.0000 | ORAL_TABLET | Freq: Two times a day (BID) | ORAL | 0 refills | Status: AC

## 2024-07-29 NOTE — Progress Notes (Signed)
 Date:  07/29/2024   Name:  Cindy Fowler   DOB:  07/18/1966   MRN:  [id number]   Chief Complaint: Sinusitis  Sinusitis This is a new problem. The current episode started 1 to 4 weeks ago. The problem is unchanged. There has been no fever. The pain is mild. Associated symptoms include congestion, coughing, headaches and sinus pressure. Pertinent negatives include no chills or shortness of breath. Past treatments include oral decongestants, nasal decongestants and saline nose sprays. The treatment provided no relief.    Review of Systems  Constitutional:  Negative for chills, fatigue and fever.  HENT:  Positive for congestion, sinus pressure and sinus pain.   Respiratory:  Positive for cough. Negative for chest tightness, shortness of breath and wheezing.   Cardiovascular:  Negative for chest pain and palpitations.  Neurological:  Positive for headaches.  Psychiatric/Behavioral:  Negative for dysphoric mood and sleep disturbance. The patient is not nervous/anxious.      Lab Results  Component Value Date   NA 139 06/20/2024   K 4.4 06/20/2024   CO2 19 (L) 06/20/2024   GLUCOSE 97 06/20/2024   BUN 11 06/20/2024   CREATININE 0.58 06/20/2024   CALCIUM 9.0 06/20/2024   EGFR 105 06/20/2024   GFRNONAA >60 12/01/2022   Lab Results  Component Value Date   CHOL 262 (H) 06/20/2024   HDL 74 06/20/2024   LDLCALC 176 (H) 06/20/2024   TRIG 72 06/20/2024   CHOLHDL 3.5 06/20/2024   Lab Results  Component Value Date   TSH 0.611 06/20/2024   Lab Results  Component Value Date   HGBA1C 5.6 06/20/2024   Lab Results  Component Value Date   WBC 4.7 12/01/2022   HGB 12.4 12/01/2022   HCT 36.8 12/01/2022   MCV 95.8 12/01/2022   PLT 187 12/01/2022   Lab Results  Component Value Date   ALT 20 12/01/2022   AST 23 12/01/2022   ALKPHOS 37 (L) 12/01/2022   BILITOT 0.6 12/01/2022   No results found for: MARIEN BOLLS, VD25OH   Patient Active Problem List    Diagnosis Date Noted   PVD (posterior vitreous detachment), bilateral 09/27/2021   History of colonic polyps 01/20/2019   Chronic pain of both knees 04/22/2018   Mixed hyperlipidemia 04/14/2015   Hypothyroidism 04/14/2015   Empty sella (HCC) 04/14/2015   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   Rheumatoid arthritis (HCC) 04/14/2015    Allergies  Allergen Reactions   Propoxyphene Nausea And Vomiting   Fluconazole Swelling    eyes   Methotrexate And Trimetrexate Hives   Clarithromycin Palpitations and Anxiety    Shakey, GI upset    Past Surgical History:  Procedure Laterality Date   ADENOIDECTOMY     carpal tunnel release Bilateral 06/04/2023   CATARACT EXTRACTION Bilateral 09/25/2013   CESAREAN SECTION  09/25/1992   DILATION AND CURETTAGE OF UTERUS     TONSILLECTOMY     TUBAL LIGATION      Social History   Tobacco Use   Smoking status: Never   Smokeless tobacco: Never  Vaping Use   Vaping status: Never Used  Substance Use Topics   Alcohol use: Yes    Alcohol/week: 0.0 standard drinks of alcohol   Drug use: No     Medication list has been reviewed and updated.  Current Meds  Medication Sig   acetaZOLAMIDE (DIAMOX) 125 MG tablet Take by mouth.   amoxicillin -clavulanate (AUGMENTIN ) 875-125 MG tablet Take 1 tablet  by mouth 2 (two) times daily for 10 days.   Calcium Carbonate 1500 (600 CA) MG TABS Take 1 tablet by mouth daily at 2 PM daily at 2 PM.   celecoxib (CELEBREX) 100 MG capsule Take 100 mg by mouth as needed.   cycloSPORINE (RESTASIS) 0.05 % ophthalmic emulsion 1 drop 2 (two) times daily.   hydroxychloroquine (PLAQUENIL) 200 MG tablet Take 200 mg by mouth 2 (two) times daily.   levothyroxine  (SYNTHROID ) 112 MCG tablet Take 1 tablet (112 mcg total) by mouth daily before breakfast.   predniSONE (DELTASONE) 10 MG tablet Take 6 tablets (60 mg total) by mouth daily with breakfast for 1 day, THEN 5 tablets (50 mg total) daily with  breakfast for 1 day, THEN 4 tablets (40 mg total) daily with breakfast for 1 day, THEN 3 tablets (30 mg total) daily with breakfast for 1 day, THEN 2 tablets (20 mg total) daily with breakfast for 1 day, THEN 1 tablet (10 mg total) daily with breakfast for 1 day.   RINVOQ 15 MG TB24 Take 1 tablet by mouth daily.   venlafaxine XR (EFFEXOR-XR) 37.5 MG 24 hr capsule Take 37.5 mg by mouth daily.       07/29/2024    9:03 AM 06/20/2024    8:33 AM 06/19/2023    9:32 AM 05/11/2022    9:55 AM  GAD 7 : Generalized Anxiety Score  Nervous, Anxious, on Edge 0 0 0 0  Control/stop worrying 0 0 0 0  Worry too much - different things 0 0 0 0  Trouble relaxing 0 0 0 0  Restless 0 0 0 0  Easily annoyed or irritable 0 0 0 0  Afraid - awful might happen 0 0 0 0  Total GAD 7 Score 0 0 0 0  Anxiety Difficulty Not difficult at all Not difficult at all Not difficult at all Not difficult at all       07/29/2024    9:03 AM 06/20/2024    8:33 AM 06/19/2023    9:32 AM  Depression screen PHQ 2/9  Decreased Interest 0 0 0  Down, Depressed, Hopeless 0 0 0  PHQ - 2 Score 0 0 0  Altered sleeping 0 0 0  Tired, decreased energy 0 0 0  Change in appetite 0 0 0  Feeling bad or failure about yourself  0 0 0  Trouble concentrating 0 0 0  Moving slowly or fidgety/restless 0 0 0  Suicidal thoughts 0 0 0  PHQ-9 Score 0 0 0  Difficult doing work/chores Not difficult at all Not difficult at all Not difficult at all    BP Readings from Last 3 Encounters:  07/29/24 124/70  06/20/24 132/78  01/11/24 136/80    Physical Exam Constitutional:      Appearance: Normal appearance. She is well-developed.  HENT:     Right Ear: Ear canal and external ear normal. Tympanic membrane is not erythematous or retracted.     Left Ear: Ear canal and external ear normal. Tympanic membrane is not erythematous or retracted.     Nose:     Right Sinus: Maxillary sinus tenderness and frontal sinus tenderness present.     Left Sinus:  Maxillary sinus tenderness and frontal sinus tenderness present.     Mouth/Throat:     Mouth: No oral lesions.     Pharynx: Uvula midline. Posterior oropharyngeal erythema present. No oropharyngeal exudate.  Cardiovascular:     Rate and Rhythm: Normal rate and regular rhythm.  Pulses: Normal pulses.     Heart sounds: Normal heart sounds.  Pulmonary:     Effort: Pulmonary effort is normal.     Breath sounds: Normal breath sounds. No wheezing, rhonchi or rales.  Musculoskeletal:     Cervical back: Normal range of motion.  Lymphadenopathy:     Cervical: No cervical adenopathy.  Skin:    General: Skin is warm and dry.  Neurological:     Mental Status: She is alert and oriented to person, place, and time.     Wt Readings from Last 3 Encounters:  07/29/24 175 lb (79.4 kg)  06/20/24 172 lb (78 kg)  01/11/24 178 lb 4 oz (80.9 kg)    BP 124/70   Pulse (!) 103   Temp 98.2 F (36.8 C) (Oral)   Ht 5' 6 (1.676 m)   Wt 175 lb (79.4 kg)   SpO2 96%   BMI 28.25 kg/m   Assessment and Plan:  Problem List Items Addressed This Visit   None Visit Diagnoses       Acute recurrent maxillary sinusitis    -  Primary   continue Flonase NS, Nyquil for cough push fluids return if no improvement   Relevant Medications   amoxicillin -clavulanate (AUGMENTIN ) 875-125 MG tablet   predniSONE (DELTASONE) 10 MG tablet       No follow-ups on file.    Leita HILARIO Adie, MD [facility] [place]

## 2024-10-17 ENCOUNTER — Other Ambulatory Visit: Payer: Self-pay | Admitting: Student

## 2024-10-17 ENCOUNTER — Ambulatory Visit: Admitting: Student

## 2024-10-17 ENCOUNTER — Encounter: Payer: Self-pay | Admitting: Student

## 2024-10-17 VITALS — BP 148/86 | HR 71 | Temp 97.9°F | Ht 66.0 in | Wt 176.4 lb

## 2024-10-17 DIAGNOSIS — I10 Essential (primary) hypertension: Secondary | ICD-10-CM | POA: Diagnosis not present

## 2024-10-17 MED ORDER — LOSARTAN POTASSIUM 25 MG PO TABS: 25.0000 mg | ORAL_TABLET | Freq: Every day | ORAL | 1 refills | Status: AC

## 2024-10-17 NOTE — Progress Notes (Signed)
 "  Established Patient Office Visit  Subjective   Patient ID: Cindy Fowler, female    DOB: Mar 23, 1966  Age: 59 y.o. MRN: [id number]  Chief Complaint  Patient presents with   Hypertension    Patient reports her BP has been elevated for 1 month, recent Ob/GYN appointment BP was elevated -  150s, 140s, has been keeping records of her readings    Cindy Fowler is a 59 y.o. person with medical hx listed below who presents today for elevated blood pressure. Noted to have elevated BP noted at [facility] office 152/87  on 11/24. Started checking at home typically around 130 over high 70s. Denies symptoms of high BP.   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    Calcium Carbonate 1500 (600 CA) MG TABS Take 1 tablet by mouth daily at 2 PM daily at 2 PM. 04/14/2015: Received from: [facility] Received Sig: Take by mouth.   celecoxib (CELEBREX) 100 MG capsule Take 100 mg by mouth as needed.    cycloSPORINE (RESTASIS) 0.05 % ophthalmic emulsion 1 drop 2 (two) times daily.    hydroxychloroquine (PLAQUENIL) 200 MG tablet Take 200 mg by mouth 2 (two) times daily.    levothyroxine  (SYNTHROID ) 112 MCG tablet Take 1 tablet (112 mcg total) by mouth daily before breakfast.    losartan  (COZAAR ) 25 MG tablet Take 1 tablet (25 mg total) by mouth daily.    RINVOQ 15 MG TB24 Take 1 tablet by mouth daily.    venlafaxine XR (EFFEXOR-XR) 37.5  MG 24 hr capsule Take 37.5 mg by mouth daily.    No facility-administered encounter medications on file as of 10/17/2024.    BP (!) 148/86   Pulse 71   Temp 97.9 F (36.6 C) (Oral)   Ht 5' 6 (1.676 m)   Wt 176 lb 6 oz (80 kg)   SpO2 97%   BMI 28.47 kg/m  BP Readings from Last 3 Encounters:  10/17/24 (!) 148/86  07/29/24 124/70  06/20/24 132/78    Physical Exam Constitutional:      Appearance: Normal appearance.  HENT:     Mouth/Throat:     Mouth: Mucous membranes are moist.     Pharynx: Oropharynx is clear.  Cardiovascular:     Rate and Rhythm: Normal rate and regular rhythm.     Heart sounds: No murmur heard. Pulmonary:     Effort: Pulmonary effort is normal.     Breath sounds: No rhonchi or rales.  Abdominal:     General: Abdomen is flat. Bowel sounds are normal. There is no distension.     Palpations: Abdomen is soft.     Tenderness: There is no abdominal tenderness.  Musculoskeletal:        General: Normal range of motion.     Right lower leg: No edema.     Left lower leg: No edema.  Skin:    General: Skin  is warm and dry.     Capillary Refill: Capillary refill takes less than 2 seconds.  Neurological:     General: No focal deficit present.     Mental Status: She is alert and oriented to person, place, and time. Mental status is at baseline.     Motor: No weakness.     Coordination: Coordination normal.  Psychiatric:        Mood and Affect: Mood normal.        Behavior: Behavior normal.        07/29/2024    9:03 AM 06/20/2024    8:33 AM 06/19/2023    9:32 AM  Depression screen PHQ 2/9  Decreased Interest 0 0 0  Down, Depressed, Hopeless 0 0 0  PHQ - 2 Score 0 0 0  Altered sleeping 0 0 0  Tired, decreased energy 0 0 0  Change in appetite 0 0 0  Feeling bad or failure about yourself  0 0 0  Trouble concentrating 0 0 0  Moving slowly or fidgety/restless 0 0 0  Suicidal thoughts 0 0 0  PHQ-9 Score 0  0  0   Difficult doing work/chores Not difficult  at all Not difficult at all Not difficult at all     Data saved with a previous flowsheet row definition       07/29/2024    9:03 AM 06/20/2024    8:33 AM 06/19/2023    9:32 AM 05/11/2022    9:55 AM  GAD 7 : Generalized Anxiety Score  Nervous, Anxious, on Edge 0  0  0  0   Control/stop worrying 0  0  0  0   Worry too much - different things 0  0  0  0   Trouble relaxing 0  0  0  0   Restless 0  0  0  0   Easily annoyed or irritable 0  0  0  0   Afraid - awful might happen 0  0  0  0   Total GAD 7 Score 0 0 0 0  Anxiety Difficulty Not difficult at all Not difficult at all Not difficult at all Not difficult at all     Data saved with a previous flowsheet row definition    No results found for any visits on 10/17/24.  Last CBC Lab Results  Component Value Date   WBC 4.7 12/01/2022   HGB 12.4 12/01/2022   HCT 36.8 12/01/2022   MCV 95.8 12/01/2022   MCH 32.3 12/01/2022   RDW 12.6 12/01/2022   PLT 187 12/01/2022   Last metabolic panel Lab Results  Component Value Date   GLUCOSE 97 06/20/2024   NA 139 06/20/2024   K 4.4 06/20/2024   CL 106 06/20/2024   CO2 19 (L) 06/20/2024   BUN 11 06/20/2024   CREATININE 0.58 06/20/2024   EGFR 105 06/20/2024   CALCIUM 9.0 06/20/2024   PROT 7.1 12/01/2022   ALBUMIN 4.2 12/01/2022   LABGLOB 1.9 05/11/2022   AGRATIO 2.4 (H) 05/11/2022   BILITOT 0.6 12/01/2022   ALKPHOS 37 (L) 12/01/2022   AST 23 12/01/2022   ALT 20 12/01/2022   ANIONGAP 7 12/01/2022   Last lipids Lab Results  Component Value Date   CHOL 262 (H) 06/20/2024   HDL 74 06/20/2024   LDLCALC 176 (H) 06/20/2024   TRIG 72 06/20/2024   CHOLHDL 3.5 06/20/2024   Last hemoglobin A1c Lab Results  Component Value Date  HGBA1C 5.6 06/20/2024      The 10-year ASCVD risk score (Arnett DK, et al., 2019) is: 4.8%    Assessment & Plan:  Primary hypertension Assessment & Plan: BP 150/90 and 146/86 on repeat in office today.home cuff seems to be reading high at 173/97.  Denies changes in routine, salt intake, NSAID use, caffeine, or pain. Given multiple elevated pressures in office and at home will start on losartan . Reviewed BP measure technique, keep log. Follow up in 1 month   Other orders -     Losartan  Potassium; Take 1 tablet (25 mg total) by mouth daily.  Dispense: 30 tablet; Refill: 1     Return in about 4 weeks (around 11/14/2024) for HTN.    Harlene Saddler, MD "

## 2024-10-17 NOTE — Telephone Encounter (Signed)
 Requested Prescriptions  Refused Prescriptions Disp Refills   losartan (COZAAR) 25 MG tablet [Pharmacy Med Name: LOSARTAN 25MG  TABLETS] 90 tablet     Sig: TAKE 1 TABLET(25 MG) BY MOUTH DAILY     Cardiovascular:  Angiotensin Receptor Blockers Failed - 10/17/2024  2:42 PM      Failed - Last BP in normal range    BP Readings from Last 1 Encounters:  10/17/24 (!) 148/86         Passed - Cr in normal range and within 180 days    Creatinine, Ser  Date Value Ref Range Status  06/20/2024 0.58 0.57 - 1.00 mg/dL Final         Passed - K in normal range and within 180 days    Potassium  Date Value Ref Range Status  06/20/2024 4.4 3.5 - 5.2 mmol/L Final         Passed - Patient is not pregnant      Passed - Valid encounter within last 6 months    Recent Outpatient Visits           Today    [facility] at [facility], [place]   2 months ago Acute recurrent maxillary sinusitis   [facility], Leita DEL, MD   3 months ago Annual physical exam   [facility] at [facility], Leita DEL, MD   9 months ago Acute recurrent maxillary sinusitis   [facility] at [facility], Leita DEL, [place]

## 2024-10-27 DIAGNOSIS — I10 Essential (primary) hypertension: Secondary | ICD-10-CM | POA: Insufficient documentation

## 2024-10-27 NOTE — Assessment & Plan Note (Signed)
 BP 150/90 and 146/86 on repeat in office today.home cuff seems to be reading high at 173/97. Denies changes in routine, salt intake, NSAID use, caffeine, or pain. Given multiple elevated pressures in office and at home will start on losartan . Reviewed BP measure technique, keep log. Follow up in 1 month

## 2024-11-18 ENCOUNTER — Ambulatory Visit: Admitting: Student

## 2025-06-26 ENCOUNTER — Encounter: Admitting: Student
